# Patient Record
Sex: Female | Born: 1990 | Race: Black or African American | Hispanic: No | Marital: Married | Smoking: Current every day smoker
Health system: Southern US, Community
[De-identification: ages and names within clinical notes are randomized; demographics above are authoritative.]

## PROBLEM LIST (undated history)

## (undated) DIAGNOSIS — F319 Bipolar disorder, unspecified: Secondary | ICD-10-CM

## (undated) DIAGNOSIS — F209 Schizophrenia, unspecified: Secondary | ICD-10-CM

## (undated) DIAGNOSIS — F32A Depression, unspecified: Secondary | ICD-10-CM

## (undated) DIAGNOSIS — F419 Anxiety disorder, unspecified: Secondary | ICD-10-CM

## (undated) HISTORY — PX: APPENDECTOMY: SHX54

## (undated) HISTORY — PX: ABDOMINAL SURGERY: SHX537

---

## 2022-02-07 ENCOUNTER — Encounter (HOSPITAL_COMMUNITY): Payer: Self-pay | Admitting: Emergency Medicine

## 2022-02-07 ENCOUNTER — Other Ambulatory Visit: Payer: Self-pay

## 2022-02-07 ENCOUNTER — Emergency Department (HOSPITAL_COMMUNITY): Payer: Medicare Other

## 2022-02-07 ENCOUNTER — Emergency Department (HOSPITAL_COMMUNITY)
Admission: EM | Admit: 2022-02-07 | Discharge: 2022-02-07 | Disposition: A | Discharge status: Home / Self Care | Payer: Medicare Other | Attending: Emergency Medicine | Admitting: Emergency Medicine

## 2022-02-07 DIAGNOSIS — N9489 Other specified conditions associated with female genital organs and menstrual cycle: Secondary | ICD-10-CM | POA: Diagnosis not present

## 2022-02-07 DIAGNOSIS — R1084 Generalized abdominal pain: Secondary | ICD-10-CM | POA: Insufficient documentation

## 2022-02-07 DIAGNOSIS — R7309 Other abnormal glucose: Secondary | ICD-10-CM | POA: Insufficient documentation

## 2022-02-07 DIAGNOSIS — R112 Nausea with vomiting, unspecified: Secondary | ICD-10-CM | POA: Insufficient documentation

## 2022-02-07 HISTORY — DX: Depression, unspecified: F32.A

## 2022-02-07 HISTORY — DX: Schizophrenia, unspecified: F20.9

## 2022-02-07 HISTORY — DX: Bipolar disorder, unspecified: F31.9

## 2022-02-07 HISTORY — DX: Anxiety disorder, unspecified: F41.9

## 2022-02-07 LAB — COMPREHENSIVE METABOLIC PANEL
ALT: 46 U/L — ABNORMAL HIGH (ref 0–44)
AST: 56 U/L — ABNORMAL HIGH (ref 15–41)
Albumin: 4.4 g/dL (ref 3.5–5.0)
Alkaline Phosphatase: 64 U/L (ref 38–126)
Anion gap: 11 (ref 5–15)
BUN: 5 mg/dL — ABNORMAL LOW (ref 6–20)
CO2: 23 mmol/L (ref 22–32)
Calcium: 9.5 mg/dL (ref 8.9–10.3)
Chloride: 103 mmol/L (ref 98–111)
Creatinine, Ser: 0.91 mg/dL (ref 0.44–1.00)
GFR, Estimated: >60 mL/min (ref >60)
Glucose, Bld: 74 mg/dL (ref 70–99)
Potassium: 4.1 mmol/L (ref 3.5–5.1)
Sodium: 137 mmol/L (ref 135–145)
Total Bilirubin: 1.5 mg/dL — ABNORMAL HIGH (ref 0.3–1.2)
Total Protein: 7.9 g/dL (ref 6.5–8.1)

## 2022-02-07 LAB — LIPASE, BLOOD: Lipase: 32 U/L (ref 11–51)

## 2022-02-07 LAB — CBC
HCT: 43.3 % (ref 36.0–46.0)
Hemoglobin: 14.5 g/dL (ref 12.0–15.0)
MCH: 33.8 pg (ref 26.0–34.0)
MCHC: 33.5 g/dL (ref 30.0–36.0)
MCV: 100.9 fL — ABNORMAL HIGH (ref 80.0–100.0)
Platelets: 191 10*3/uL (ref 150–400)
RBC: 4.29 MIL/uL (ref 3.87–5.11)
RDW: 15.1 % (ref 11.5–15.5)
WBC: 7.4 10*3/uL (ref 4.0–10.5)
nRBC: 0 % (ref 0.0–0.2)

## 2022-02-07 LAB — CBG MONITORING, ED
Glucose-Capillary: 66 mg/dL — ABNORMAL LOW (ref 70–99)
Glucose-Capillary: 68 mg/dL — ABNORMAL LOW (ref 70–99)

## 2022-02-07 LAB — I-STAT BETA HCG BLOOD, ED (MC, WL, AP ONLY): I-stat hCG, quantitative: <5 m[IU]/mL (ref <5)

## 2022-02-07 MED ORDER — DIPHENHYDRAMINE HCL 50 MG/ML IJ SOLN: 25.0000 mg | Freq: Once | INTRAMUSCULAR | Status: DC

## 2022-02-07 MED ORDER — PROMETHAZINE HCL 25 MG RE SUPP: 25.0000 mg | Freq: Four times a day (QID) | RECTAL | 0 refills | Status: DC | PRN

## 2022-02-07 MED ORDER — IOHEXOL 300 MG/ML  SOLN
80.0000 mL | Freq: Once | INTRAMUSCULAR | Status: AC | PRN
  Administered 2022-02-07: 80 mL via INTRAVENOUS

## 2022-02-07 MED ORDER — ONDANSETRON 4 MG PO TBDP: ORAL_TABLET | ORAL | 0 refills | Status: DC

## 2022-02-07 MED ORDER — ALUM & MAG HYDROXIDE-SIMETH 200-200-20 MG/5ML PO SUSP: 30.0000 mL | Freq: Once | ORAL | Status: DC

## 2022-02-07 MED ORDER — PROCHLORPERAZINE EDISYLATE 10 MG/2ML IJ SOLN
10.0000 mg | Freq: Once | INTRAMUSCULAR | Status: DC
Start: 2022-02-07 — End: 2022-02-08

## 2022-02-07 NOTE — ED Provider Triage Note (Signed)
Emergency Medicine Provider Triage Evaluation Note ? ?Jenna Shelton , a 31 y.o. female  was evaluated in triage.  Pt complains of abdominal pain, nausea, vomiting since yesterday.  Says that this started out of nowhere.  Has a history of appendectomy and bowel obstruction x2.  Did have abdominal surgery for this around 10 years ago.  No urinary symptoms or vaginal symptoms.  Says that she is throwing up everything that she eats or drinks ? ?Review of Systems  ?Positive: As above ?Negative: Constipation or diarrhea ? ?Physical Exam  ?BP 116/81   Pulse 93   Temp 97.9 ?F (36.6 ?C) (Oral)   Resp 17   Ht 5\' 7"  (1.702 m)   Wt 64.9 kg   SpO2 100%   BMI 22.40 kg/m?  ?Gen:   Awake, no distress   ?Resp:  Normal effort  ?MSK:   Moves extremities without difficulty  ?Other:  Tenderness to epigastrium and right upper quadrant.  Positive Murphy sign.  ? ?Medical Decision Making  ?Medically screening exam initiated at 12:04 PM.  Appropriate orders placed.  Harold Sweeting was informed that the remainder of the evaluation will be completed by another provider, this initial triage assessment does not replace that evaluation, and the importance of remaining in the ED until their evaluation is complete. ? ?CT ordered ?  ? , PA-C ?02/07/22 1207 ? ?

## 2022-02-07 NOTE — ED Triage Notes (Signed)
Pt reports eating a [place] sandwich yesterday, vomited after. Pt reports pain all day yesterday. Pt states vomited several times this morning. Pt alert, appropriate pain 10/10. Hx of bowel obstruction and appy. NAD at present.  ?

## 2022-02-07 NOTE — ED Provider Notes (Signed)
?[facility] EMERGENCY DEPARTMENT ?Provider Note ? ? ?CSN: [id number] ?Arrival date & time: 02/07/22  1126 ? ?  ? ?History ? ?Chief Complaint  ?Patient presents with  ? Abdominal Pain  ? ? ?Jenna Shelton is a 31 y.o. female. ? ?31 yo F with a cc of abdominal pain and vomiting.  Started this morning.  No fevers, no known sick contacts, no diarrhea.  Mild diffuse abdominal pain.  Hx of sbo while pregnant.   ? ? ?Abdominal Pain ? ?  ? ?Home Medications ?Prior to Admission medications   ?Medication Sig Start Date End Date Taking? Authorizing Provider  ?ondansetron (ZOFRAN-ODT) 4 MG disintegrating tablet 4mg  ODT q4 hours prn nausea/vomit 02/07/22  Yes 04/09/22, DO  ?promethazine (PHENERGAN) 25 MG suppository Place 1 suppository (25 mg total) rectally every 6 (six) hours as needed for nausea or vomiting. 02/07/22  Yes 04/09/22, DO  ?   ? ?Allergies    ?Tape, Chocolate, and Tramadol   ? ?Review of Systems   ?Review of Systems  ?Gastrointestinal:  Positive for abdominal pain.  ? ?Physical Exam ?Updated Vital Signs ?BP 125/90 (BP Location: Right Arm)   Pulse 78   Temp 98.1 ?F (36.7 ?C) (Oral)   Resp 18   Ht 5\' 7"  (1.702 m)   Wt 64.9 kg   SpO2 100%   BMI 22.40 kg/m?  ?Physical Exam ?Vitals and nursing note reviewed.  ?Constitutional:   ?   General: She is not in acute distress. ?   Appearance: She is well-developed. She is not diaphoretic.  ?HENT:  ?   Head: Normocephalic and atraumatic.  ?Eyes:  ?   Pupils: Pupils are equal, round, and reactive to light.  ?Cardiovascular:  ?   Rate and Rhythm: Normal rate and regular rhythm.  ?   Heart sounds: No murmur heard. ?  No friction rub. No gallop.  ?Pulmonary:  ?   Effort: Pulmonary effort is normal.  ?   Breath sounds: No wheezing or rales.  ?Abdominal:  ?   General: There is no distension.  ?   Palpations: Abdomen is soft.  ?   Tenderness: There is no abdominal tenderness.  ?   Comments: Benign abdominal exam  ?Musculoskeletal:     ?   General: No  tenderness.  ?   Cervical back: Normal range of motion and neck supple.  ?Skin: ?   General: Skin is warm and dry.  ?Neurological:  ?   Mental Status: She is alert and oriented to person, place, and time.  ?Psychiatric:     ?   Behavior: Behavior normal.  ? ? ?ED Results / Procedures / Treatments   ?Labs ?(all labs ordered are listed, but only abnormal results are displayed) ?Labs Reviewed  ?COMPREHENSIVE METABOLIC PANEL - Abnormal; Notable for the following components:  ?    Result Value  ? BUN 5 (*)   ? AST 56 (*)   ? ALT 46 (*)   ? Total Bilirubin 1.5 (*)   ? All other components within normal limits  ?CBC - Abnormal; Notable for the following components:  ? MCV 100.9 (*)   ? All other components within normal limits  ?CBG MONITORING, ED - Abnormal; Notable for the following components:  ? Glucose-Capillary 66 (*)   ? All other components within normal limits  ?CBG MONITORING, ED - Abnormal; Notable for the following components:  ? Glucose-Capillary 68 (*)   ? All other components within normal limits  ?  LIPASE, BLOOD  ?URINALYSIS, ROUTINE W REFLEX MICROSCOPIC  ?I-STAT BETA HCG BLOOD, ED ([facility], [facility], [facility] ONLY)  ?CBG MONITORING, ED  ? ? ?EKG ?None ? ?Radiology ?CT ABDOMEN PELVIS W CONTRAST ? ?Result Date: 02/07/2022 ?CLINICAL DATA:  Abdominal pain, acute, nonlocalized EXAM: CT ABDOMEN AND PELVIS WITH CONTRAST TECHNIQUE: Multidetector CT imaging of the abdomen and pelvis was performed using the standard protocol following bolus administration of intravenous contrast. RADIATION DOSE REDUCTION: This exam was performed according to the departmental dose-optimization program which includes automated exposure control, adjustment of the mA and/or kV according to patient size and/or use of iterative reconstruction technique. CONTRAST:  55mL OMNIPAQUE IOHEXOL 300 MG/ML  SOLN COMPARISON:  None Available. FINDINGS: Lower chest: No acute abnormality. Hepatobiliary: No focal liver abnormality is seen. The gallbladder is unremarkable.  Pancreas: Unremarkable. No pancreatic ductal dilatation or surrounding inflammatory changes. Spleen: Normal in size without focal abnormality. Adrenals/Urinary Tract: Adrenal glands are unremarkable. No hydronephrosis or nephrolithiasis. The bladder is mildly distended. Stomach/Bowel: The stomach is within normal limits. There is no evidence of bowel obstruction.Prior appendectomy. Vascular/Lymphatic: Aortoiliac atherosclerosis. No AAA. No lymphadenopathy. Reproductive: Unremarkable for age. There is an IUD in place. There is a spongiform material within the vagina, possibly a tampon. Other: No abdominopelvic ascites.  No free air.  No hernia. Musculoskeletal: No acute or significant osseous findings. IMPRESSION: No acute findings in the abdomen or pelvis. Prior appendectomy. Tampon in place. Electronically Signed   By: Caprice Renshaw M.D.   On: 02/07/2022 14:20  ? ?CT US GUIDE VASC ACCESS LT NO REPORT ? ?Result Date: 02/07/2022 ?There is no Radiologist interpretation  for this exam.  ? ?Procedures ?Procedures  ? ? ?Medications Ordered in ED ?Medications  ?prochlorperazine (COMPAZINE) injection 10 mg (has no administration in time range)  ?diphenhydrAMINE (BENADRYL) injection 25 mg (has no administration in time range)  ?alum & mag hydroxide-simeth (MAALOX/MYLANTA) 200-200-20 MG/5ML suspension 30 mL (has no administration in time range)  ?iohexol (OMNIPAQUE) 300 MG/ML solution 80 mL (80 mLs Intravenous Contrast Given 02/07/22 1359)  ? ? ?ED Course/ Medical Decision Making/ A&P ?  ?                        ?Medical Decision Making ?Risk ?OTC drugs. ?Prescription drug management. ? ? ?31 yo F with a cc of n/v.  Started this morning.  CT without concerning finding.  No significant abdominal pain on exam.  Labwork without significant anemia, no significant electrolyte abnormalities.  LFT and lipase unremarkable. POC glucose mildly low.  Oral trial antiemetics.   ? ?Patient tells me she feels better a month ago home.  We will  have her follow-up with her family doctor. ? ?7:31 PM:  I have discussed the diagnosis/risks/treatment options with the patient.  Evaluation and diagnostic testing in the emergency department does not suggest an emergent condition requiring admission or immediate intervention beyond what has been performed at this time.  They will follow up with  PCP. We also discussed returning to the ED immediately if new or worsening sx occur. We discussed the sx which are most concerning (e.g., sudden worsening pain, fever, inability to tolerate by mouth) that necessitate immediate return. Medications administered to the patient during their visit and any new prescriptions provided to the patient are listed below. ? ?Medications given during this visit ?Medications  ?prochlorperazine (COMPAZINE) injection 10 mg (has no administration in time range)  ?diphenhydrAMINE (BENADRYL) injection 25 mg (has no administration in time  range)  ?alum & mag hydroxide-simeth (MAALOX/MYLANTA) 200-200-20 MG/5ML suspension 30 mL (has no administration in time range)  ?iohexol (OMNIPAQUE) 300 MG/ML solution 80 mL (80 mLs Intravenous Contrast Given 02/07/22 1359)  ? ? ? ?The patient appears reasonably screen and/or stabilized for discharge and I doubt any other medical condition or other [facility] requiring further screening, evaluation, or treatment in the ED at this time prior to discharge.  ? ? ? ? ? ? ? ? ?Final Clinical Impression(s) / ED Diagnoses ?Final diagnoses:  ?Generalized abdominal pain  ? ? ?Rx / DC Orders ?ED Discharge Orders   ? ?      Ordered  ?  promethazine (PHENERGAN) 25 MG suppository  Every 6 hours PRN       ? 02/07/22 1930  ?  ondansetron (ZOFRAN-ODT) 4 MG disintegrating tablet       ? 02/07/22 1930  ? ?  ?  ? ?  ? ? ?  ?Melene PlanFloyd, Darryl Willner, DO ?02/07/22 1931 ? ?

## 2022-02-07 NOTE — ED Notes (Signed)
Patient walking out of room. Patient asked to stay to removed IV. Patient refuses to stay and talk to doctor. Warned of risks of leaving. IV removed. Patient leaves promptly to lobby with family. MD notified. ?

## 2022-02-07 NOTE — ED Notes (Signed)
Requested urine from pt, pt stated "does not have to urinate". This NT notified pt that urine is apart of their care while being treated in the ED, without urine it cane delay the pt's care. Pt acknowledges. ?

## 2022-03-24 ENCOUNTER — Emergency Department (EMERGENCY_DEPARTMENT_HOSPITAL): Payer: Medicare Other | Admitting: Anesthesiology

## 2022-03-24 ENCOUNTER — Other Ambulatory Visit: Payer: Self-pay

## 2022-03-24 ENCOUNTER — Emergency Department (HOSPITAL_COMMUNITY): Payer: Medicare Other

## 2022-03-24 ENCOUNTER — Emergency Department (HOSPITAL_COMMUNITY): Payer: Medicare Other | Admitting: Anesthesiology

## 2022-03-24 ENCOUNTER — Encounter (HOSPITAL_COMMUNITY): Payer: Self-pay

## 2022-03-24 ENCOUNTER — Encounter (HOSPITAL_COMMUNITY)
Admission: EM | Disposition: A | Discharge status: Home / Self Care | Payer: Self-pay | Source: Home / Self Care | Attending: Obstetrics and Gynecology

## 2022-03-24 ENCOUNTER — Inpatient Hospital Stay (HOSPITAL_COMMUNITY)
Admission: EM | Admit: 2022-03-24 | Discharge: 2022-03-28 | DRG: 743 | Disposition: A | Discharge status: Home / Self Care | Payer: Medicare Other | Attending: Obstetrics and Gynecology | Admitting: Obstetrics and Gynecology

## 2022-03-24 DIAGNOSIS — N83512 Torsion of left ovary and ovarian pedicle: Principal | ICD-10-CM | POA: Diagnosis present

## 2022-03-24 DIAGNOSIS — R110 Nausea: Secondary | ICD-10-CM

## 2022-03-24 DIAGNOSIS — R1032 Left lower quadrant pain: Secondary | ICD-10-CM

## 2022-03-24 DIAGNOSIS — N736 Female pelvic peritoneal adhesions (postinfective): Secondary | ICD-10-CM | POA: Diagnosis present

## 2022-03-24 DIAGNOSIS — Z5331 Laparoscopic surgical procedure converted to open procedure: Secondary | ICD-10-CM | POA: Diagnosis not present

## 2022-03-24 DIAGNOSIS — Z9889 Other specified postprocedural states: Secondary | ICD-10-CM

## 2022-03-24 DIAGNOSIS — N83519 Torsion of ovary and ovarian pedicle, unspecified side: Secondary | ICD-10-CM | POA: Diagnosis present

## 2022-03-24 HISTORY — PX: DIAGNOSTIC LAPAROSCOPY WITH REMOVAL OF ECTOPIC PREGNANCY: SHX6449

## 2022-03-24 HISTORY — DX: Torsion of ovary and ovarian pedicle, unspecified side: N83.519

## 2022-03-24 HISTORY — PX: OOPHORECTOMY: SHX6387

## 2022-03-24 LAB — URINALYSIS, ROUTINE W REFLEX MICROSCOPIC
Bacteria, UA: NONE SEEN
Bilirubin Urine: NEGATIVE
Glucose, UA: NEGATIVE mg/dL
Hgb urine dipstick: NEGATIVE
Ketones, ur: 80 mg/dL — AB
Leukocytes,Ua: NEGATIVE
Nitrite: NEGATIVE
Protein, ur: 100 mg/dL — AB
Specific Gravity, Urine: 1.024 (ref 1.005–1.030)
pH: 7 (ref 5.0–8.0)

## 2022-03-24 LAB — CBC
HCT: 42.8 % (ref 36.0–46.0)
Hemoglobin: 14.8 g/dL (ref 12.0–15.0)
MCH: 35.7 pg — ABNORMAL HIGH (ref 26.0–34.0)
MCHC: 34.6 g/dL (ref 30.0–36.0)
MCV: 103.1 fL — ABNORMAL HIGH (ref 80.0–100.0)
Platelets: UNDETERMINED 10*3/uL (ref 150–400)
RBC: 4.15 MIL/uL (ref 3.87–5.11)
RDW: 13.4 % (ref 11.5–15.5)
WBC: 12.5 10*3/uL — ABNORMAL HIGH (ref 4.0–10.5)
nRBC: 0 % (ref 0.0–0.2)

## 2022-03-24 LAB — COMPREHENSIVE METABOLIC PANEL
ALT: 26 U/L (ref 0–44)
AST: 28 U/L (ref 15–41)
Albumin: 5 g/dL (ref 3.5–5.0)
Alkaline Phosphatase: 63 U/L (ref 38–126)
Anion gap: 15 (ref 5–15)
BUN: 8 mg/dL (ref 6–20)
CO2: 21 mmol/L — ABNORMAL LOW (ref 22–32)
Calcium: 10.3 mg/dL (ref 8.9–10.3)
Chloride: 100 mmol/L (ref 98–111)
Creatinine, Ser: 0.76 mg/dL (ref 0.44–1.00)
GFR, Estimated: >60 mL/min (ref >60)
Glucose, Bld: 121 mg/dL — ABNORMAL HIGH (ref 70–99)
Potassium: 3.7 mmol/L (ref 3.5–5.1)
Sodium: 136 mmol/L (ref 135–145)
Total Bilirubin: 0.9 mg/dL (ref 0.3–1.2)
Total Protein: 9.1 g/dL — ABNORMAL HIGH (ref 6.5–8.1)

## 2022-03-24 LAB — I-STAT BETA HCG BLOOD, ED (MC, WL, AP ONLY): I-stat hCG, quantitative: <5 m[IU]/mL (ref <5)

## 2022-03-24 LAB — TYPE AND SCREEN
ABO/RH(D): O POS
Antibody Screen: NEGATIVE

## 2022-03-24 LAB — LIPASE, BLOOD: Lipase: 21 U/L (ref 11–51)

## 2022-03-24 LAB — GLUCOSE, CAPILLARY: Glucose-Capillary: 125 mg/dL — ABNORMAL HIGH (ref 70–99)

## 2022-03-24 LAB — ABO/RH: ABO/RH(D): O POS

## 2022-03-24 SURGERY — LAPAROSCOPY, WITH ECTOPIC PREGNANCY SURGICAL TREATMENT
Anesthesia: General

## 2022-03-24 MED ORDER — HYDROMORPHONE HCL 1 MG/ML IJ SOLN
0.2500 mg | INTRAMUSCULAR | Status: DC | PRN
  Administered 2022-03-24 (×2): 0.5 mg via INTRAVENOUS

## 2022-03-24 MED ORDER — MORPHINE SULFATE (PF) 4 MG/ML IV SOLN
4.0000 mg | Freq: Once | INTRAVENOUS | Status: AC
  Administered 2022-03-24: 4 mg via INTRAVENOUS
  Filled 2022-03-24: qty 1

## 2022-03-24 MED ORDER — ACETAMINOPHEN 10 MG/ML IV SOLN
INTRAVENOUS | Status: AC
  Filled 2022-03-24: qty 100

## 2022-03-24 MED ORDER — ROCURONIUM BROMIDE 100 MG/10ML IV SOLN
INTRAVENOUS | Status: DC | PRN
  Administered 2022-03-24: 40 mg via INTRAVENOUS
  Administered 2022-03-24: 10 mg via INTRAVENOUS
  Administered 2022-03-24: 20 mg via INTRAVENOUS
  Administered 2022-03-24: 10 mg via INTRAVENOUS

## 2022-03-24 MED ORDER — FENTANYL CITRATE (PF) 100 MCG/2ML IJ SOLN
INTRAMUSCULAR | Status: DC | PRN
Start: 2022-03-24 — End: 2022-03-24
  Administered 2022-03-24 (×2): 50 ug via INTRAVENOUS
  Administered 2022-03-24: 100 ug via INTRAVENOUS
  Administered 2022-03-24 (×3): 50 ug via INTRAVENOUS
  Administered 2022-03-24: 100 ug via INTRAVENOUS
  Administered 2022-03-24: 50 ug via INTRAVENOUS

## 2022-03-24 MED ORDER — KETOROLAC TROMETHAMINE 30 MG/ML IJ SOLN: 30.0000 mg | Freq: Once | INTRAMUSCULAR | Status: DC

## 2022-03-24 MED ORDER — OXYCODONE HCL 5 MG PO TABS: 5.0000 mg | ORAL_TABLET | Freq: Once | ORAL | Status: DC | PRN

## 2022-03-24 MED ORDER — GLYCOPYRROLATE PF 0.2 MG/ML IJ SOSY
PREFILLED_SYRINGE | INTRAMUSCULAR | Status: AC
  Filled 2022-03-24: qty 1

## 2022-03-24 MED ORDER — LACTATED RINGERS IV SOLN: INTRAVENOUS | Status: DC

## 2022-03-24 MED ORDER — DEXAMETHASONE SODIUM PHOSPHATE 10 MG/ML IJ SOLN
INTRAMUSCULAR | Status: DC | PRN
  Administered 2022-03-24: 10 mg via INTRAVENOUS

## 2022-03-24 MED ORDER — KETOROLAC TROMETHAMINE 30 MG/ML IJ SOLN
INTRAMUSCULAR | Status: DC | PRN
  Administered 2022-03-24: 30 mg via INTRAVENOUS

## 2022-03-24 MED ORDER — CEFAZOLIN SODIUM-DEXTROSE 2-3 GM-%(50ML) IV SOLR
INTRAVENOUS | Status: DC | PRN
  Administered 2022-03-24: 2 g via INTRAVENOUS

## 2022-03-24 MED ORDER — BUPIVACAINE HCL (PF) 0.25 % IJ SOLN
INTRAMUSCULAR | Status: AC
  Filled 2022-03-24: qty 30

## 2022-03-24 MED ORDER — BUPIVACAINE HCL (PF) 0.25 % IJ SOLN
INTRAMUSCULAR | Status: DC | PRN
  Administered 2022-03-24: 6 mL

## 2022-03-24 MED ORDER — BUPIVACAINE LIPOSOME 1.3 % IJ SUSP
INTRAMUSCULAR | Status: DC | PRN
  Administered 2022-03-24: 20 mL

## 2022-03-24 MED ORDER — SUCCINYLCHOLINE CHLORIDE 200 MG/10ML IV SOSY
PREFILLED_SYRINGE | INTRAVENOUS | Status: AC
  Filled 2022-03-24: qty 10

## 2022-03-24 MED ORDER — ROCURONIUM BROMIDE 10 MG/ML (PF) SYRINGE
PREFILLED_SYRINGE | INTRAVENOUS | Status: AC
  Filled 2022-03-24: qty 10

## 2022-03-24 MED ORDER — HYDROMORPHONE HCL 1 MG/ML IJ SOLN
1.0000 mg | Freq: Once | INTRAMUSCULAR | Status: AC
  Administered 2022-03-24: 1 mg via INTRAVENOUS
  Filled 2022-03-24: qty 1

## 2022-03-24 MED ORDER — KETAMINE HCL 10 MG/ML IJ SOLN
INTRAMUSCULAR | Status: DC | PRN
  Administered 2022-03-24 (×2): 25 mg via INTRAVENOUS

## 2022-03-24 MED ORDER — MIDAZOLAM HCL 2 MG/2ML IJ SOLN: 0.5000 mg | Freq: Once | INTRAMUSCULAR | Status: DC | PRN

## 2022-03-24 MED ORDER — SUCCINYLCHOLINE CHLORIDE 200 MG/10ML IV SOSY
PREFILLED_SYRINGE | INTRAVENOUS | Status: DC | PRN
  Administered 2022-03-24: 100 mg via INTRAVENOUS

## 2022-03-24 MED ORDER — SCOPOLAMINE 1 MG/3DAYS TD PT72
1.0000 | MEDICATED_PATCH | TRANSDERMAL | Status: AC
  Administered 2022-03-24: 1 via TRANSDERMAL

## 2022-03-24 MED ORDER — PHENYLEPHRINE HCL (PRESSORS) 10 MG/ML IV SOLN
INTRAVENOUS | Status: DC | PRN
  Administered 2022-03-24 (×3): 40 ug via INTRAVENOUS

## 2022-03-24 MED ORDER — MIDAZOLAM HCL 2 MG/2ML IJ SOLN
INTRAMUSCULAR | Status: AC
  Filled 2022-03-24: qty 2

## 2022-03-24 MED ORDER — PROPOFOL 10 MG/ML IV BOLUS
INTRAVENOUS | Status: AC
  Filled 2022-03-24: qty 20

## 2022-03-24 MED ORDER — ACETAMINOPHEN 10 MG/ML IV SOLN
INTRAVENOUS | Status: DC | PRN
  Administered 2022-03-24: 1000 mg via INTRAVENOUS

## 2022-03-24 MED ORDER — OXYCODONE HCL 5 MG/5ML PO SOLN: 5.0000 mg | Freq: Once | ORAL | Status: DC | PRN

## 2022-03-24 MED ORDER — ORAL CARE MOUTH RINSE: 15.0000 mL | Freq: Once | OROMUCOSAL | Status: DC

## 2022-03-24 MED ORDER — FENTANYL CITRATE (PF) 250 MCG/5ML IJ SOLN
INTRAMUSCULAR | Status: AC
  Filled 2022-03-24: qty 5

## 2022-03-24 MED ORDER — 0.9 % SODIUM CHLORIDE (POUR BTL) OPTIME
TOPICAL | Status: DC | PRN
  Administered 2022-03-24: 1000 mL

## 2022-03-24 MED ORDER — LIDOCAINE 2% (20 MG/ML) 5 ML SYRINGE
INTRAMUSCULAR | Status: AC
  Filled 2022-03-24: qty 5

## 2022-03-24 MED ORDER — POVIDONE-IODINE 10 % EX SWAB: 2.0000 | Freq: Once | CUTANEOUS | Status: DC

## 2022-03-24 MED ORDER — LIDOCAINE HCL (CARDIAC) PF 100 MG/5ML IV SOSY
PREFILLED_SYRINGE | INTRAVENOUS | Status: DC | PRN
  Administered 2022-03-24: 50 mg via INTRAVENOUS

## 2022-03-24 MED ORDER — HYDROMORPHONE HCL 1 MG/ML IJ SOLN
INTRAMUSCULAR | Status: AC
  Filled 2022-03-24: qty 1

## 2022-03-24 MED ORDER — KETAMINE HCL 50 MG/5ML IJ SOSY
PREFILLED_SYRINGE | INTRAMUSCULAR | Status: AC
  Filled 2022-03-24: qty 5

## 2022-03-24 MED ORDER — ONDANSETRON HCL 4 MG/2ML IJ SOLN
INTRAMUSCULAR | Status: DC | PRN
  Administered 2022-03-24: 4 mg via INTRAVENOUS

## 2022-03-24 MED ORDER — SODIUM CHLORIDE 0.9 % IV BOLUS
1000.0000 mL | Freq: Once | INTRAVENOUS | Status: AC
  Administered 2022-03-24: 1000 mL via INTRAVENOUS

## 2022-03-24 MED ORDER — PROPOFOL 10 MG/ML IV BOLUS
INTRAVENOUS | Status: DC | PRN
  Administered 2022-03-24: 200 mg via INTRAVENOUS

## 2022-03-24 MED ORDER — MEPERIDINE HCL 25 MG/ML IJ SOLN: 6.2500 mg | INTRAMUSCULAR | Status: DC | PRN

## 2022-03-24 MED ORDER — SCOPOLAMINE 1 MG/3DAYS TD PT72
MEDICATED_PATCH | TRANSDERMAL | Status: AC
  Filled 2022-03-24: qty 1

## 2022-03-24 MED ORDER — ONDANSETRON HCL 4 MG/2ML IJ SOLN
INTRAMUSCULAR | Status: AC
  Filled 2022-03-24: qty 2

## 2022-03-24 MED ORDER — SUGAMMADEX SODIUM 200 MG/2ML IV SOLN
INTRAVENOUS | Status: DC | PRN
  Administered 2022-03-24: 200 mg via INTRAVENOUS

## 2022-03-24 MED ORDER — HEMOSTATIC AGENTS (NO CHARGE) OPTIME
TOPICAL | Status: DC | PRN
  Administered 2022-03-24: 1 via TOPICAL

## 2022-03-24 MED ORDER — BUPIVACAINE LIPOSOME 1.3 % IJ SUSP
INTRAMUSCULAR | Status: AC
  Filled 2022-03-24: qty 20

## 2022-03-24 MED ORDER — ONDANSETRON HCL 4 MG/2ML IJ SOLN
4.0000 mg | Freq: Once | INTRAMUSCULAR | Status: AC
Start: 2022-03-24 — End: 2022-03-24
  Administered 2022-03-24: 4 mg via INTRAVENOUS
  Filled 2022-03-24: qty 2

## 2022-03-24 MED ORDER — LACTATED RINGERS IV SOLN: INTRAVENOUS | Status: DC | PRN

## 2022-03-24 MED ORDER — IOHEXOL 300 MG/ML  SOLN
80.0000 mL | Freq: Once | INTRAMUSCULAR | Status: AC | PRN
Start: 2022-03-24 — End: 2022-03-24
  Administered 2022-03-24: 80 mL via INTRAVENOUS

## 2022-03-24 MED ORDER — CHLORHEXIDINE GLUCONATE 0.12 % MT SOLN
OROMUCOSAL | Status: AC
  Filled 2022-03-24: qty 15

## 2022-03-24 MED ORDER — ONDANSETRON HCL 4 MG/2ML IJ SOLN
4.0000 mg | Freq: Once | INTRAMUSCULAR | Status: AC
  Administered 2022-03-24: 4 mg via INTRAVENOUS
  Filled 2022-03-24: qty 2

## 2022-03-24 MED ORDER — MIDAZOLAM HCL 2 MG/2ML IJ SOLN
INTRAMUSCULAR | Status: DC | PRN
  Administered 2022-03-24: 2 mg via INTRAVENOUS

## 2022-03-24 MED ORDER — CHLORHEXIDINE GLUCONATE 0.12 % MT SOLN: 15.0000 mL | Freq: Once | OROMUCOSAL | Status: DC

## 2022-03-24 SURGICAL SUPPLY — 44 items
CABLE HIGH FREQUENCY MONO STRZ (ELECTRODE) IMPLANT
CNTNR URN SCR LID CUP LEK RST (MISCELLANEOUS) IMPLANT
CONT SPEC 4OZ STRL OR WHT (MISCELLANEOUS) ×2
DERMABOND ADVANCED (GAUZE/BANDAGES/DRESSINGS)
DERMABOND ADVANCED .7 DNX12 (GAUZE/BANDAGES/DRESSINGS) IMPLANT
DRAPE HYSTEROSCOPY (MISCELLANEOUS) ×1 IMPLANT
DRSG OPSITE POSTOP 4X10 (GAUZE/BANDAGES/DRESSINGS) ×1 IMPLANT
DURAPREP 26ML APPLICATOR (WOUND CARE) ×3 IMPLANT
GLOVE BIO SURGEON STRL SZ 6.5 (GLOVE) ×3 IMPLANT
GLOVE BIOGEL PI IND STRL 7.0 (GLOVE) ×8 IMPLANT
GLOVE BIOGEL PI INDICATOR 7.0 (GLOVE) ×4
GOWN STRL REUS W/ TWL LRG LVL3 (GOWN DISPOSABLE) ×4 IMPLANT
GOWN STRL REUS W/TWL LRG LVL3 (GOWN DISPOSABLE) ×2
IRRIG SUCT STRYKERFLOW 2 WTIP (MISCELLANEOUS)
IRRIGATION SUCT STRKRFLW 2 WTP (MISCELLANEOUS) IMPLANT
KIT TURNOVER KIT B (KITS) ×3 IMPLANT
NDL INSUFFLATION 14GA 120MM (NEEDLE) ×2 IMPLANT
NEEDLE INSUFFLATION 14GA 120MM (NEEDLE) ×3 IMPLANT
NS IRRIG 1000ML POUR BTL (IV SOLUTION) ×3 IMPLANT
PACK LAPAROSCOPY BASIN (CUSTOM PROCEDURE TRAY) ×3 IMPLANT
PACK TRENDGUARD 450 HYBRID PRO (MISCELLANEOUS) IMPLANT
PENCIL SMOKE EVACUATOR (MISCELLANEOUS) ×1 IMPLANT
POUCH SPECIMEN RETRIEVAL 10MM (ENDOMECHANICALS) ×1 IMPLANT
POWDER SURGICEL 3.0 GRAM (HEMOSTASIS) ×1 IMPLANT
PROTECTOR NERVE ULNAR (MISCELLANEOUS) ×6 IMPLANT
SET TUBE SMOKE EVAC HIGH FLOW (TUBING) ×3 IMPLANT
SHEARS HARMONIC ACE PLUS 36CM (ENDOMECHANICALS) IMPLANT
SHEET LAVH (DRAPES) ×1 IMPLANT
SLEEVE ENDOPATH XCEL 5M (ENDOMECHANICALS) ×3 IMPLANT
STAPLER VISISTAT 35W (STAPLE) ×1 IMPLANT
STRIP CLOSURE SKIN 1/2X4 (GAUZE/BANDAGES/DRESSINGS) IMPLANT
SUT PDS AB 0 CTX 60 (SUTURE) ×2 IMPLANT
SUT VICRYL 0 UR6 27IN ABS (SUTURE) ×3 IMPLANT
SUT VICRYL 4-0 PS2 18IN ABS (SUTURE) ×3 IMPLANT
TOWEL GREEN STERILE FF (TOWEL DISPOSABLE) ×6 IMPLANT
TRAY FOLEY W/BAG SLVR 14FR (SET/KITS/TRAYS/PACK) ×3 IMPLANT
TRENDGUARD 450 HYBRID PRO PACK (MISCELLANEOUS) ×3
TROCAR BALLN 12MMX100 BLUNT (TROCAR) ×1 IMPLANT
TROCAR XCEL DIL TIP R 11M (ENDOMECHANICALS) ×3 IMPLANT
TROCAR XCEL NON-BLD 11X100MML (ENDOMECHANICALS) ×3 IMPLANT
TROCAR XCEL NON-BLD 5MMX100MML (ENDOMECHANICALS) ×3 IMPLANT
TUBE CONNECTING 12X1/4 (SUCTIONS) ×1 IMPLANT
WARMER LAPAROSCOPE (MISCELLANEOUS) ×3 IMPLANT
YANKAUER SUCT BULB TIP NO VENT (SUCTIONS) ×1 IMPLANT

## 2022-03-24 NOTE — ED Triage Notes (Signed)
Patient arrived by [facility] with complaint of abdominal pain with vomiting since last night, reports loose stool last pm. Denies fever, complains of sharp abdominal pain

## 2022-03-24 NOTE — H&P (Signed)
Faculty Practice Obstetrics and Gynecology Attending History and Physical 31 y.o. G0, No LMP recorded. Patient has Mirena in place for 2 years.  Jenna Shelton is a 31 y.o. female history of schizophrenia, bipolar, prior appendectomy, prior bowel obstruction for patient here for evaluation of abdominal pain.  Located to her left mid and lower abdomen.  Pain started yesterday.  Constant in nature.  She describes it as severe.  Her last bowel movement was yesterday without any melena or blood per rectum.  No dysuria, hematuria or flank pain.  She has history of prior bowel obstructions however she is unsure if this feels similar.  She has had multiple episodes of emesis, her last episode had a few specks of bright red blood.  She denies any chronic NSAID use, EtOH use.  No history of gastric ulcers.  No fever, headache, congestion, rhinorrhea, cough, back pain.  She denies chance of pregnancy.  No vaginal discharge or concern for STDs.  Took ODT Zofran last night. US shows enlargement of left ovary and no doppler flow c/w torsion. Still has significant LLQ pain  Past Medical History:  Diagnosis Date   Anxiety    Bipolar 1 disorder (HCC)    Depression    Schizophrenia (HCC)    Past Surgical History:  Procedure Laterality Date   ABDOMINAL SURGERY     APPENDECTOMY     OB History  No obstetric history on file.  Patient denies any other pertinent gynecologic issues.  No current facility-administered medications on file prior to encounter.   Current Outpatient Medications on File Prior to Encounter  Medication Sig Dispense Refill   ondansetron (ZOFRAN-ODT) 4 MG disintegrating tablet 4mg  ODT q4 hours prn nausea/vomit 20 tablet 0   promethazine (PHENERGAN) 25 MG suppository Place 1 suppository (25 mg total) rectally every 6 (six) hours as needed for nausea or vomiting. 12 each 0   Allergies  Allergen Reactions   Tape Hives    blisters   Chocolate Rash   Tramadol Rash    Social History:    reports current drug use. Drug: Marijuana. No family history on file.  Review of Systems: Pertinent items noted in HPI and remainder of comprehensive ROS otherwise negative.  PHYSICAL EXAM: Blood pressure (!) 137/99, pulse 72, temperature 98 F (36.7 C), resp. rate 15, SpO2 100 %. CONSTITUTIONAL: Well-developed, well-nourished female in moderate distress HENT:  Normocephalic, atraumatic, External right and left ear normal. Oropharynx is clear and moist EYES: Conjunctivae and EOM are normal. Pupils are equal, round, and reactive to light. No scleral icterus.  NECK: Normal range of motion, supple, no masses SKIN: Skin is warm and dry. No rash noted. Not diaphoretic. No erythema. No pallor. NEUROLOGIC: Alert and oriented to person, place, and time. Normal reflexes, muscle tone coordination. No cranial nerve deficit noted. PSYCHIATRIC: Normal mood and affect. Normal behavior. Normal judgment and thought content. CARDIOVASCULAR: Normal heart rate noted, regular rhythm RESPIRATORY: Effort and breath sounds normal, no problems with respiration noted ABDOMEN: Guarding and LLQ tenderness no distension or mass  MUSCULOSKELETAL: Normal range of motion. No tenderness.  No cyanosis, clubbing, or edema.  .  Labs: Results for orders placed or performed during the hospital encounter of 03/24/22 (from the past 336 hour(s))  CBC   Collection Time: 03/24/22 12:47 PM  Result Value Ref Range   WBC 12.5 (H) 4.0 - 10.5 K/uL   RBC 4.15 3.87 - 5.11 MIL/uL   Hemoglobin 14.8 12.0 - 15.0 g/dL   HCT 03/26/22 69.4 -  46.0 %   MCV 103.1 (H) 80.0 - 100.0 fL   MCH 35.7 (H) 26.0 - 34.0 pg   MCHC 34.6 30.0 - 36.0 g/dL   RDW 94.1 74.0 - 81.4 %   Platelets PLATELET CLUMPS NOTED ON SMEAR, UNABLE TO ESTIMATE 150 - 400 K/uL   nRBC 0.0 0.0 - 0.2 %  Urinalysis, Routine w reflex microscopic Urine, Clean Catch   Collection Time: 03/24/22 12:47 PM  Result Value Ref Range   Color, Urine YELLOW YELLOW   APPearance HAZY (A)  CLEAR   Specific Gravity, Urine 1.024 1.005 - 1.030   pH 7.0 5.0 - 8.0   Glucose, UA NEGATIVE NEGATIVE mg/dL   Hgb urine dipstick NEGATIVE NEGATIVE   Bilirubin Urine NEGATIVE NEGATIVE   Ketones, ur 80 (A) NEGATIVE mg/dL   Protein, ur 481 (A) NEGATIVE mg/dL   Nitrite NEGATIVE NEGATIVE   Leukocytes,Ua NEGATIVE NEGATIVE   RBC / HPF 6-10 0 - 5 RBC/hpf   WBC, UA 0-5 0 - 5 WBC/hpf   Bacteria, UA NONE SEEN NONE SEEN   Squamous Epithelial / LPF 0-5 0 - 5   Mucus PRESENT   I-Stat beta hCG blood, ED   Collection Time: 03/24/22  1:01 PM  Result Value Ref Range   I-stat hCG, quantitative <5.0 <5 mIU/mL   Comment 3          Comprehensive metabolic panel   Collection Time: 03/24/22  2:30 PM  Result Value Ref Range   Sodium 136 135 - 145 mmol/L   Potassium 3.7 3.5 - 5.1 mmol/L   Chloride 100 98 - 111 mmol/L   CO2 21 (L) 22 - 32 mmol/L   Glucose, Bld 121 (H) 70 - 99 mg/dL   BUN 8 6 - 20 mg/dL   Creatinine, Ser 8.56 0.44 - 1.00 mg/dL   Calcium 31.4 8.9 - 97.0 mg/dL   Total Protein 9.1 (H) 6.5 - 8.1 g/dL   Albumin 5.0 3.5 - 5.0 g/dL   AST 28 15 - 41 U/L   ALT 26 0 - 44 U/L   Alkaline Phosphatase 63 38 - 126 U/L   Total Bilirubin 0.9 0.3 - 1.2 mg/dL   GFR, Estimated >26 >37 mL/min   Anion gap 15 5 - 15  Lipase, blood   Collection Time: 03/24/22  2:30 PM  Result Value Ref Range   Lipase 21 11 - 51 U/L    Imaging Studies: US PELVIC COMPLETE W TRANSVAGINAL AND TORSION R/O  Result Date: 03/24/2022 CLINICAL DATA:  Left lower quadrant and pelvic pain. Left adnexal mass on recent ultrasound. EXAM: TRANSABDOMINAL AND TRANSVAGINAL ULTRASOUND OF PELVIS DOPPLER ULTRASOUND OF OVARIES TECHNIQUE: Both transabdominal and transvaginal ultrasound examinations of the pelvis were performed. Transabdominal technique was performed for global imaging of the pelvis including uterus, ovaries, adnexal regions, and pelvic cul-de-sac. It was necessary to proceed with endovaginal exam following the transabdominal  exam to visualize the ovaries and left adnexal mass. Color and duplex Doppler ultrasound was utilized to evaluate blood flow to the ovaries. COMPARISON:  CT on 03/24/2022 FINDINGS: Uterus Measurements: 7.1 x 3.1 x 4.0 cm = volume: 44 mL. No fibroids or other mass visualized. Endometrium Thickness: 4 mm.  IUD is visualized within the endometrial cavity. Right ovary Measurements: 3.4 x 1.6 x 2.0 cm = volume: 5.5 mL. Normal appearance/no adnexal mass. Internal blood flow seen on color Doppler ultrasound. Left ovary Measurements: 4.4 x 3.4 x 3.3 cm = volume: 26.2 mL. The left ovary has a heterogeneous  appearance with presence of numerous tiny internal follicles as well as an area which appears hemorrhagic. No internal blood flow is seen on color Doppler ultrasound. Pulsed Doppler evaluation of both ovaries demonstrates normal low-resistance arterial and venous waveforms in the right ovary. No internal blood flow is identified within the left ovary with either duplex or power Doppler ultrasound. This is consistent with left ovarian torsion. Other findings No abnormal free fluid. IMPRESSION: Findings consistent with left ovarian torsion. Normal appearance of right ovary. IUD within the endometrial cavity. Critical Value/emergent results were called by telephone at the time of interpretation on 03/24/2022 at 5:14 pm to provider [facility] , who verbally acknowledged these results. Electronically Signed   By: Danae Orleans M.D.   On: 03/24/2022 17:17   CT ABDOMEN PELVIS W CONTRAST  Result Date: 03/24/2022 CLINICAL DATA:  Left lower quadrant pain and left pelvic pain beginning last night. Vomiting. EXAM: CT ABDOMEN AND PELVIS WITH CONTRAST TECHNIQUE: Multidetector CT imaging of the abdomen and pelvis was performed using the standard protocol following bolus administration of intravenous contrast. RADIATION DOSE REDUCTION: This exam was performed according to the departmental dose-optimization program which includes  automated exposure control, adjustment of the mA and/or kV according to patient size and/or use of iterative reconstruction technique. CONTRAST:  35mL OMNIPAQUE IOHEXOL 300 MG/ML  SOLN COMPARISON:  02/07/2022 FINDINGS: Lower Chest: No acute findings. Hepatobiliary: No hepatic masses identified. Gallbladder is unremarkable. No evidence of biliary ductal dilatation. Pancreas:  No mass or inflammatory changes. Spleen: Within normal limits in size and appearance. Adrenals/Urinary Tract: No masses identified. No evidence of ureteral calculi or hydronephrosis. Stomach/Bowel: No evidence of obstruction, inflammatory process or abnormal fluid collections. Vascular/Lymphatic: No pathologically enlarged lymph nodes. No acute vascular findings. Aortic atherosclerotic calcification incidentally noted. Reproductive: IUD is again noted. A new low-attenuation mass is seen in the left adnexa which measures 4.2 x 3.6 cm. This is suspicious for an ovarian mass or cyst. No evidence of pelvic inflammatory process or free fluid. Other:  None. Musculoskeletal:  No suspicious bone lesions identified. IMPRESSION: New 4 cm low-attenuation mass in left adnexa, suspicious for ovarian mass or cyst. Pelvic ultrasound is recommended for further characterization. Aortic Atherosclerosis (ICD10-I70.0). Electronically Signed   By: Danae Orleans M.D.   On: 03/24/2022 17:09    Assessment: Patient Active Problem List   Diagnosis Date Noted   Ovarian torsion 03/24/2022      Plan: Ovarian torsion suspected. Offered laparoscopy and repair of torsion, salpingo-oophorectomy if indicated. .  The risks of surgery were discussed in detail with the patient including but not limited to: bleeding which may require transfusion or reoperation; infection which may require prolonged hospitalization or re-hospitalization and antibiotic therapy; injury to bowel, bladder, ureters and major vessels or other surrounding organs which may lead to other procedures;  formation of adhesions; need for additional procedures including laparotomy or subsequent procedures secondary to intraoperative injury or abnormal pathology; thromboembolic phenomenon; incisional problems and other postoperative or anesthesia complications.  Patient was told that the likelihood that her condition and symptoms will be treated effectively with this surgical management was very high; the postoperative expectations were also discussed in detail. The patient also understands the alternative treatment options which were discussed in full. All questions were answered.    Adam Phenix, MD Obstetrician & Gynecologist, [facility], [facility]

## 2022-03-24 NOTE — ED Notes (Signed)
Pt is going to [facility] in OR.

## 2022-03-24 NOTE — ED Provider Notes (Signed)
[facility] EMERGENCY DEPARTMENT Provider Note   CSN: [id number] Arrival date & time: 03/24/22  1235    History  ABD pain   Sibyl Ertle is a 31 y.o. female history of schizophrenia, bipolar, prior appendectomy, prior bowel obstruction for patient here for evaluation of abdominal pain.  Located to her left mid and lower abdomen.  Pain started yesterday.  Constant in nature.  She describes it as severe.  Her last bowel movement was yesterday without any melena or blood per rectum.  No dysuria, hematuria or flank pain.  She has history of prior bowel obstructions however she is unsure if this feels similar.  She has had multiple episodes of emesis, her last episode had a few specks of bright red blood.  She denies any chronic NSAID use, EtOH use.  No history of gastric ulcers.  No fever, headache, congestion, rhinorrhea, cough, back pain.  She denies chance of pregnancy.  No vaginal discharge or concern for STDs.  Took ODT Zofran last night.  HPI     Home Medications Prior to Admission medications   Medication Sig Start Date End Date Taking? Authorizing Provider  ondansetron (ZOFRAN-ODT) 4 MG disintegrating tablet 4mg  ODT q4 hours prn nausea/vomit 02/07/22   04/09/22, DO  promethazine (PHENERGAN) 25 MG suppository Place 1 suppository (25 mg total) rectally every 6 (six) hours as needed for nausea or vomiting. 02/07/22   04/09/22, DO      Allergies    Tape, Chocolate, and Tramadol    Review of Systems   Review of Systems  Constitutional: Negative.   HENT: Negative.    Respiratory: Negative.    Cardiovascular: Negative.   Gastrointestinal:  Positive for abdominal pain, nausea and vomiting. Negative for abdominal distention, anal bleeding, blood in stool, constipation, diarrhea and rectal pain.  Genitourinary: Negative.   Musculoskeletal: Negative.   Skin: Negative.   Neurological: Negative.   All other systems reviewed and are negative.   Physical  Exam Updated Vital Signs BP (!) 137/99 (BP Location: Right Arm)   Pulse 72   Temp 98 F (36.7 C)   Resp 15   SpO2 100%  Physical Exam Vitals and nursing note reviewed.  Constitutional:      General: She is not in acute distress.    Appearance: She is well-developed. She is not ill-appearing, toxic-appearing or diaphoretic.  HENT:     Head: Normocephalic and atraumatic.     Nose: Nose normal.     Mouth/Throat:     Mouth: Mucous membranes are moist.  Eyes:     Pupils: Pupils are equal, round, and reactive to light.  Cardiovascular:     Rate and Rhythm: Normal rate.     Pulses: Normal pulses.     Heart sounds: Normal heart sounds.  Pulmonary:     Effort: Pulmonary effort is normal. No respiratory distress.     Breath sounds: Normal breath sounds.     Comments: Clear bilaterally, speaks in full sentences without difficulty Abdominal:     General: There is no distension.     Palpations: Abdomen is soft.     Tenderness: There is abdominal tenderness. There is no right CVA tenderness, left CVA tenderness, guarding or rebound.     Hernia: No hernia is present.     Comments: Diffuse left-sided tenderness worse left lower quadrant  Musculoskeletal:        General: Normal range of motion.     Cervical back: Normal range of motion.  Comments: Full range of motion  Skin:    General: Skin is warm and dry.     Capillary Refill: Capillary refill takes less than 2 seconds.     Comments: No rashes or lesions  Neurological:     General: No focal deficit present.     Mental Status: She is alert and oriented to person, place, and time.  Psychiatric:        Mood and Affect: Mood normal.     ED Results / Procedures / Treatments   Labs (all labs ordered are listed, but only abnormal results are displayed) Labs Reviewed  CBC - Abnormal; Notable for the following components:      Result Value   WBC 12.5 (*)    MCV 103.1 (*)    MCH 35.7 (*)    All other components within normal  limits  URINALYSIS, ROUTINE W REFLEX MICROSCOPIC - Abnormal; Notable for the following components:   APPearance HAZY (*)    Ketones, ur 80 (*)    Protein, ur 100 (*)    All other components within normal limits  COMPREHENSIVE METABOLIC PANEL - Abnormal; Notable for the following components:   CO2 21 (*)    Glucose, Bld 121 (*)    Total Protein 9.1 (*)    All other components within normal limits  LIPASE, BLOOD  I-STAT BETA HCG BLOOD, ED ([facility], [facility], [facility] ONLY)    EKG None  Radiology US PELVIC COMPLETE W TRANSVAGINAL AND TORSION R/O  Result Date: 03/24/2022 CLINICAL DATA:  Left lower quadrant and pelvic pain. Left adnexal mass on recent ultrasound. EXAM: TRANSABDOMINAL AND TRANSVAGINAL ULTRASOUND OF PELVIS DOPPLER ULTRASOUND OF OVARIES TECHNIQUE: Both transabdominal and transvaginal ultrasound examinations of the pelvis were performed. Transabdominal technique was performed for global imaging of the pelvis including uterus, ovaries, adnexal regions, and pelvic cul-de-sac. It was necessary to proceed with endovaginal exam following the transabdominal exam to visualize the ovaries and left adnexal mass. Color and duplex Doppler ultrasound was utilized to evaluate blood flow to the ovaries. COMPARISON:  CT on 03/24/2022 FINDINGS: Uterus Measurements: 7.1 x 3.1 x 4.0 cm = volume: 44 mL. No fibroids or other mass visualized. Endometrium Thickness: 4 mm.  IUD is visualized within the endometrial cavity. Right ovary Measurements: 3.4 x 1.6 x 2.0 cm = volume: 5.5 mL. Normal appearance/no adnexal mass. Internal blood flow seen on color Doppler ultrasound. Left ovary Measurements: 4.4 x 3.4 x 3.3 cm = volume: 26.2 mL. The left ovary has a heterogeneous appearance with presence of numerous tiny internal follicles as well as an area which appears hemorrhagic. No internal blood flow is seen on color Doppler ultrasound. Pulsed Doppler evaluation of both ovaries demonstrates normal low-resistance arterial and venous  waveforms in the right ovary. No internal blood flow is identified within the left ovary with either duplex or power Doppler ultrasound. This is consistent with left ovarian torsion. Other findings No abnormal free fluid. IMPRESSION: Findings consistent with left ovarian torsion. Normal appearance of right ovary. IUD within the endometrial cavity. Critical Value/emergent results were called by telephone at the time of interpretation on 03/24/2022 at 5:14 pm to provider [facility] , who verbally acknowledged these results. Electronically Signed   By: Danae Orleans M.D.   On: 03/24/2022 17:17   CT ABDOMEN PELVIS W CONTRAST  Result Date: 03/24/2022 CLINICAL DATA:  Left lower quadrant pain and left pelvic pain beginning last night. Vomiting. EXAM: CT ABDOMEN AND PELVIS WITH CONTRAST TECHNIQUE: Multidetector CT imaging of  the abdomen and pelvis was performed using the standard protocol following bolus administration of intravenous contrast. RADIATION DOSE REDUCTION: This exam was performed according to the departmental dose-optimization program which includes automated exposure control, adjustment of the mA and/or kV according to patient size and/or use of iterative reconstruction technique. CONTRAST:  51mL OMNIPAQUE IOHEXOL 300 MG/ML  SOLN COMPARISON:  02/07/2022 FINDINGS: Lower Chest: No acute findings. Hepatobiliary: No hepatic masses identified. Gallbladder is unremarkable. No evidence of biliary ductal dilatation. Pancreas:  No mass or inflammatory changes. Spleen: Within normal limits in size and appearance. Adrenals/Urinary Tract: No masses identified. No evidence of ureteral calculi or hydronephrosis. Stomach/Bowel: No evidence of obstruction, inflammatory process or abnormal fluid collections. Vascular/Lymphatic: No pathologically enlarged lymph nodes. No acute vascular findings. Aortic atherosclerotic calcification incidentally noted. Reproductive: IUD is again noted. A new low-attenuation mass is seen  in the left adnexa which measures 4.2 x 3.6 cm. This is suspicious for an ovarian mass or cyst. No evidence of pelvic inflammatory process or free fluid. Other:  None. Musculoskeletal:  No suspicious bone lesions identified. IMPRESSION: New 4 cm low-attenuation mass in left adnexa, suspicious for ovarian mass or cyst. Pelvic ultrasound is recommended for further characterization. Aortic Atherosclerosis (ICD10-I70.0). Electronically Signed   By: Marlaine Hind M.D.   On: 03/24/2022 17:09    Procedures .Critical Care  Performed by: Nettie Elm, PA-C Authorized by: Nettie Elm, PA-C   Critical care provider statement:    Critical care time (minutes):  45   Critical care was necessary to treat or prevent imminent or life-threatening deterioration of the following conditions: Ovarian torsion.   Critical care was time spent personally by me on the following activities:  Development of treatment plan with patient or surrogate, discussions with consultants, evaluation of patient's response to treatment, examination of patient, ordering and review of laboratory studies, ordering and review of radiographic studies, ordering and performing treatments and interventions, pulse oximetry, re-evaluation of patient's condition and review of old charts     Medications Ordered in ED Medications  ondansetron (ZOFRAN) injection 4 mg (4 mg Intravenous Given 03/24/22 1534)  morphine (PF) 4 MG/ML injection 4 mg (4 mg Intravenous Given 03/24/22 1535)  sodium chloride 0.9 % bolus 1,000 mL (1,000 mLs Intravenous New Bag/Given 03/24/22 1533)  iohexol (OMNIPAQUE) 300 MG/ML solution 80 mL (80 mLs Intravenous Contrast Given 03/24/22 1654)  HYDROmorphone (DILAUDID) injection 1 mg (1 mg Intravenous Given 03/24/22 1727)  ondansetron (ZOFRAN) injection 4 mg (4 mg Intravenous Given 03/24/22 1727)    ED Course/ Medical Decision Making/ A&P    31 year old history of schizophrenia, bipolar, prior appendectomy, bowel  obstruction per patient here for evaluation of left-sided abdominal pain worse to left lower quadrant.  Pain began yesterday.  Pain has been constant in nature she describes as sharp and aching.  She denies any dysuria, hematuria, flank pain.  No history of kidney stones, pyelonephritis.  She denies any vaginal discharge, concern for STDs.  Last bowel movement was yesterday without any melena or bright blood per rectum.  No history of diverticulitis, IBD.  Does admit to multiple episodes of emesis, her last episode had a few specks of bright red blood.  She denies any chronic NSAID use, EtOH use.  No sore throat, neck pain.  Plan on treating her pain, emesis, labs and imaging and reassess  Labs and imaging personally viewed and interpreted:  CBC leukocytosis at [id number] Metabolic panel glucose [id number] Lipase 21 Pregnancy test negative Ultrasound pelvic with torsion  rule out shows CT abdomen and pelvis UA shows ketones, proteinuria suspect due to her emesis and dehydration.  She is given IV fluids CT AP with mass versus cyst at left ovary, recommend ultrasound  Called by radiology.  On [place] there is concern for ovarian torsion on the left which, will discuss with OB/GYN  CONSULT with Dr. Roselie Awkward with [facility], has reviewed imaging.  He will be down to assess the patient to determine disposition  Patient has been assessed at bedside by Dr. Roselie Awkward with OB/GYN.  He will take patient emergently to the OR for her ovarian torsion. Additional pain meds and antiemetics given as patient still in significant pain with persistent emesis.  The patient appears reasonably stabilized for admission considering the current resources, flow, and capabilities available in the ED at this time, and I doubt any other [facility] requiring further screening and/or treatment in the ED prior to admission.                            Medical Decision Making Amount and/or Complexity of Data Reviewed External Data Reviewed: labs, radiology and  notes. Labs: ordered. Decision-making details documented in ED Course. Radiology: ordered and independent interpretation performed. Decision-making details documented in ED Course.  Risk OTC drugs. Prescription drug management. Parenteral controlled substances. Diagnosis or treatment significantly limited by social determinants of health.         Final Clinical Impression(s) / ED Diagnoses Final diagnoses:  Ovarian torsion    Rx / DC Orders ED Discharge Orders     None         Katrenia Alkins A, PA-C 03/24/22 1747    Dorie Rank, MD 03/25/22 1459

## 2022-03-24 NOTE — Anesthesia Preprocedure Evaluation (Addendum)
Anesthesia Evaluation  Patient identified by MRN, date of birth, ID band Patient awake    Reviewed: Allergy & Precautions, NPO status , Patient's Chart, lab work & pertinent test results  History of Anesthesia Complications Negative for: history of anesthetic complications  Airway Mallampati: II  TM Distance: >3 FB Neck ROM: Full    Dental  (+) Dental Advisory Given   Pulmonary Current Smoker and Patient abstained from smoking.,    breath sounds clear to auscultation       Cardiovascular (-) angina Rhythm:Regular Rate:Normal     Neuro/Psych Anxiety Depression Bipolar Disorder Schizophrenia negative neurological ROS     GI/Hepatic negative GI ROS, (+)     substance abuse  marijuana use,   Endo/Other  negative endocrine ROS  Renal/GU negative Renal ROS     Musculoskeletal   Abdominal   Peds  Hematology negative hematology ROS (+)   Anesthesia Other Findings   Reproductive/Obstetrics Ovarian torsion                            Anesthesia Physical Anesthesia Plan  ASA: 2 and emergent  Anesthesia Plan: General   Post-op Pain Management: Ofirmev IV (intra-op)*   Induction: Intravenous  PONV Risk Score and Plan: 3 and Ondansetron, Dexamethasone and Scopolamine patch - Pre-op  Airway Management Planned: Oral ETT  Additional Equipment: None  Intra-op Plan:   Post-operative Plan: Extubation in OR  Informed Consent: I have reviewed the patients History and Physical, chart, labs and discussed the procedure including the risks, benefits and alternatives for the proposed anesthesia with the patient or authorized representative who has indicated his/her understanding and acceptance.     Dental advisory given  Plan Discussed with: CRNA and Surgeon  Anesthesia Plan Comments:        Anesthesia Quick Evaluation

## 2022-03-24 NOTE — Anesthesia Procedure Notes (Signed)
Procedure Name: Intubation Date/Time: 03/24/2022 7:00 PM  Performed by: Rica Records, CRNAPre-anesthesia Checklist: Patient identified, Emergency Drugs available, Suction available and Patient being monitored Patient Re-evaluated:Patient Re-evaluated prior to induction Oxygen Delivery Method: Circle system utilized Preoxygenation: Pre-oxygenation with 100% oxygen Induction Type: IV induction, Rapid sequence and Cricoid Pressure applied Laryngoscope Size: Miller and 2 Grade View: Grade I Tube size: 7.0 mm Number of attempts: 1 Airway Equipment and Method: Stylet Placement Confirmation: ETT inserted through vocal cords under direct vision, positive ETCO2 and breath sounds checked- equal and bilateral Secured at: 22 cm Tube secured with: Tape Dental Injury: Teeth and Oropharynx as per pre-operative assessment

## 2022-03-24 NOTE — Anesthesia Postprocedure Evaluation (Signed)
Anesthesia Post Note  Patient: [name], advertising  Procedure(s) Performed: DIAGNOSTIC LAPAROSCOPY FOR OVARIAN TORSION OOPHORECTOMY (Left)     Patient location during evaluation: PACU Anesthesia Type: General Level of consciousness: awake and alert, patient cooperative and oriented Pain management: pain level controlled (pain improving) Vital Signs Assessment: post-procedure vital signs reviewed and stable Respiratory status: spontaneous breathing, nonlabored ventilation and respiratory function stable Cardiovascular status: blood pressure returned to baseline and stable Postop Assessment: no apparent nausea or vomiting Anesthetic complications: no   No notable events documented.  Last Vitals:  Vitals:   03/24/22 2255 03/24/22 2315  BP: (!) 143/97 (!) 129/91  Pulse: (!) 102 (!) 104  Resp: 15 (!) 27  Temp: 36.5 C   SpO2: 100% 100%    Last Pain:  Vitals:   03/24/22 2315  PainSc: 10-Worst pain ever                 Amoree Newlon,E. Sherhonda Gaspar

## 2022-03-24 NOTE — Transfer of Care (Signed)
Immediate Anesthesia Transfer of Care Note  Patient: Jenna Shelton  Procedure(s) Performed: DIAGNOSTIC LAPAROSCOPY FOR OVARIAN TORSION OOPHORECTOMY (Left)  Patient Location: PACU  Anesthesia Type:General  Level of Consciousness: awake, alert , oriented and patient cooperative  Airway & Oxygen Therapy: Patient Spontanous Breathing and Patient connected to face mask  Post-op Assessment: Report given to RN, Post -op Vital signs reviewed and stable and Patient moving all extremities X 4  Post vital signs: Reviewed and stable  Last Vitals:  Vitals Value Taken Time  BP 143/97 03/24/22 2300  Temp 36.5 C 03/24/22 2255  Pulse 107 03/24/22 2302  Resp 17 03/24/22 2303  SpO2 100 % 03/24/22 2302  Vitals shown include unvalidated device data.  Last Pain:  Vitals:   03/24/22 1728  PainSc: 10-Worst pain ever         Complications: No notable events documented.

## 2022-03-24 NOTE — Op Note (Signed)
Diagnostic Laparoscopy Procedure Note  Indications: The patient is a 31 y.o. female with LLQ pain nausea.  Pre-operative Diagnosis: Ovarian torsion  Post-operative Diagnosis: Necrotic left ovarian and extensive abdominal-pelvic adhesions  Surgeon: Scheryl Darter MD  Assistants: Duane Lope MD  Anesthesia: General endotracheal anesthesia  ASA Class: 2  Procedure Details  The patient was seen in the Holding Room. The risks, benefits, complications, treatment options, and expected outcomes were discussed with the patient. The possibilities of reaction to medication, pulmonary aspiration, perforation of viscus, bleeding, recurrent infection, the need for additional procedures, failure to diagnose a condition, and creating a complication requiring transfusion or operation were discussed with the patient. The patient concurred with the proposed plan, giving informed consent. The patient was taken to the Operating Room, identified as Jenna Shelton and the procedure verified as Diagnostic Laparoscopy. A Time Out was held and the above information confirmed.  After induction of general anesthesia, the patient was placed in modified dorsal lithotomy position where she was prepped, draped, and catheterized in the normal, sterile fashion.  The cervix was visualized and an intrauterine manipulator was placed. A 11 cm umbilical incision was then performed.  The patient has a vertical skin incision from previous laparotomy for appendectomy and bowel obstruction.  The fascia was opened and a Hassan trocar was placed.  I was unable to enter the peritoneal cavity.  I elected to place a 5 mm trocar in the left upper quadrant and a 5 mm scope was used to visualize the abdominal cavity.  CO2 was insufflated.  There were adhesions of the anterior abdomen of the bowel to up to the umbilicus.  The pelvis was encased in adhesions and I was unable to visualize the uterus tubes or ovaries.  As it appeared she would need a  laparotomy with extensive bowel adhesions I called for assistance from Dr. Omelia Blackwater your.  1 more 5 mm port was placed on the left side in order to place a grasper to assess the adhesions.  We elected at that point to proceed with a laparotomy.  #10 blade was used to make a vertical skin incision at the prior at the site of her previous incision.  This was carried down to the fascia and the fascial incision was extended vertically.  Peritoneal cavity was entered.  Immediately encountered adhesions of the small bowel and were unable to initially palpate uterus tubes or ovaries.  Using dissection with Metzenbaum scissors the bowel adhesions were dissected and we attempted to enter the retroperitoneum on the left side.  This involved at least an hour of dissection to reach the retroperitoneal space on that side.  The left ureter was identified and was intact.  Eventually the dome of the uterus was exposed.  The left adnexa were encased with adhesions.  The left ovary appeared to be necrotic and hemorrhagic.  Portions of the left ovary were dissected and there was good hemostasis seen.  This was possibly due to either acute or chronic torsion.  Surgicel gel was placed for hemostasis.  The right adnexa was not exposed.  It too was encased in adhesions.  There was no sign of any bowel injury.  All abdominal packs were removed.  A looped PDS suture was used to close the fascia with a running suture.  The skin incision was irrigated and hemostasis was assured.  The skin was closed with skin staples.  The fascial incision at the umbilicus was closed with 0 Vicryl.  Skin incisions were closed with  staples.  Sterile dressing was applied.  Patient had good urine output from the Foley catheter during the procedure.  The uterine trocar was removed prior to the laparotomy.  Patient received 2 g of cefazolin prior to his laparotomy.  All counts were correct.  She was brought in stable condition to PACU.                                                                  Following the procedure the umbilical sheath was removed after intra-abdominal carbon dioxide was expressed. The incision was closed with subcutaneous and subcuticular sutures of 4-0 Vicryl. The intrauterine manipulator was then removed.  Instrument, sponge, and needle counts were correct prior to abdominal closure and at the conclusion of the case.             Estimated Blood Loss:  less than 100 mL         Drains: Foley catheter         Total IV Fluids:         Specimens: portions of left ovary              Complications:  None; patient tolerated the procedure well.         Disposition: PACU - hemodynamically stable.         Condition: stable  Adam Phenix, MD 03/24/2022 11:27 PM

## 2022-03-24 NOTE — Progress Notes (Signed)
Spoke to patients sister to update her about the patient going for surgery.  Patients sister is Rudene Christians [phone] and she is about 1 hour from hospital but on her way

## 2022-03-25 ENCOUNTER — Encounter (HOSPITAL_COMMUNITY): Payer: Self-pay | Admitting: Obstetrics & Gynecology

## 2022-03-25 LAB — CBC
HCT: 39.2 % (ref 36.0–46.0)
Hemoglobin: 13.4 g/dL (ref 12.0–15.0)
MCH: 34.7 pg — ABNORMAL HIGH (ref 26.0–34.0)
MCHC: 34.2 g/dL (ref 30.0–36.0)
MCV: 101.6 fL — ABNORMAL HIGH (ref 80.0–100.0)
Platelets: 237 10*3/uL (ref 150–400)
RBC: 3.86 MIL/uL — ABNORMAL LOW (ref 3.87–5.11)
RDW: 13.5 % (ref 11.5–15.5)
WBC: 16.4 10*3/uL — ABNORMAL HIGH (ref 4.0–10.5)
nRBC: 0 % (ref 0.0–0.2)

## 2022-03-25 LAB — BASIC METABOLIC PANEL
Anion gap: 9 (ref 5–15)
BUN: 7 mg/dL (ref 6–20)
CO2: 22 mmol/L (ref 22–32)
Calcium: 8.7 mg/dL — ABNORMAL LOW (ref 8.9–10.3)
Chloride: 102 mmol/L (ref 98–111)
Creatinine, Ser: 0.88 mg/dL (ref 0.44–1.00)
GFR, Estimated: >60 mL/min (ref >60)
Glucose, Bld: 154 mg/dL — ABNORMAL HIGH (ref 70–99)
Potassium: 3.5 mmol/L (ref 3.5–5.1)
Sodium: 133 mmol/L — ABNORMAL LOW (ref 135–145)

## 2022-03-25 LAB — GLUCOSE, CAPILLARY: Glucose-Capillary: 120 mg/dL — ABNORMAL HIGH (ref 70–99)

## 2022-03-25 MED ORDER — DIPHENHYDRAMINE HCL 50 MG/ML IJ SOLN
25.0000 mg | Freq: Once | INTRAMUSCULAR | Status: DC
Start: 2022-03-25 — End: 2022-03-25

## 2022-03-25 MED ORDER — BUTORPHANOL TARTRATE 1 MG/ML IJ SOLN: 1.0000 mg | Freq: Once | INTRAMUSCULAR | Status: DC

## 2022-03-25 MED ORDER — BUTORPHANOL TARTRATE 2 MG/ML IJ SOLN
1.0000 mg | Freq: Once | INTRAMUSCULAR | Status: AC
  Administered 2022-03-25: 1 mg via INTRAVENOUS
  Filled 2022-03-25: qty 0.5

## 2022-03-25 MED ORDER — DIPHENHYDRAMINE HCL 50 MG/ML IJ SOLN
50.0000 mg | Freq: Once | INTRAMUSCULAR | Status: DC
Start: 2022-03-25 — End: 2022-03-25

## 2022-03-25 MED ORDER — HYDROMORPHONE HCL 1 MG/ML IJ SOLN
0.2000 mg | INTRAMUSCULAR | Status: DC | PRN
  Administered 2022-03-25 – 2022-03-26 (×7): 0.6 mg via INTRAVENOUS
  Filled 2022-03-25 (×7): qty 1

## 2022-03-25 MED ORDER — DIPHENHYDRAMINE HCL 50 MG/ML IJ SOLN
50.0000 mg | Freq: Once | INTRAMUSCULAR | Status: AC
Start: 2022-03-25 — End: 2022-03-25
  Administered 2022-03-25: 50 mg via INTRAVENOUS
  Filled 2022-03-25: qty 1

## 2022-03-25 MED ORDER — ONDANSETRON HCL 4 MG/2ML IJ SOLN
4.0000 mg | Freq: Four times a day (QID) | INTRAMUSCULAR | Status: DC | PRN
  Administered 2022-03-25 (×2): 4 mg via INTRAVENOUS
  Filled 2022-03-25 (×2): qty 2

## 2022-03-25 MED ORDER — KETOROLAC TROMETHAMINE 30 MG/ML IJ SOLN
30.0000 mg | Freq: Four times a day (QID) | INTRAMUSCULAR | Status: AC
  Administered 2022-03-25 (×4): 30 mg via INTRAVENOUS
  Filled 2022-03-25 (×4): qty 1

## 2022-03-25 MED ORDER — LACTATED RINGERS IV SOLN: INTRAVENOUS | Status: DC

## 2022-03-25 MED ORDER — ONDANSETRON HCL 4 MG PO TABS
4.0000 mg | ORAL_TABLET | Freq: Four times a day (QID) | ORAL | Status: DC | PRN
Start: 2022-03-25 — End: 2022-03-26

## 2022-03-25 MED ORDER — DIPHENHYDRAMINE HCL 50 MG/ML IJ SOLN
25.0000 mg | Freq: Once | INTRAMUSCULAR | Status: AC
Start: 2022-03-25 — End: 2022-03-25
  Administered 2022-03-25: 25 mg via INTRAVENOUS
  Filled 2022-03-25: qty 1

## 2022-03-25 MED ORDER — GABAPENTIN 100 MG PO CAPS
100.0000 mg | ORAL_CAPSULE | Freq: Three times a day (TID) | ORAL | Status: DC
  Filled 2022-03-25: qty 1

## 2022-03-25 MED ORDER — NICOTINE 14 MG/24HR TD PT24
14.0000 mg | MEDICATED_PATCH | Freq: Every day | TRANSDERMAL | Status: DC
Start: 2022-03-25 — End: 2022-03-28
  Administered 2022-03-25 – 2022-03-28 (×4): 14 mg via TRANSDERMAL
  Filled 2022-03-25 (×4): qty 1

## 2022-03-25 MED ORDER — ZOLPIDEM TARTRATE 5 MG PO TABS: 5.0000 mg | ORAL_TABLET | Freq: Every evening | ORAL | Status: DC | PRN

## 2022-03-25 MED ORDER — OXYCODONE-ACETAMINOPHEN 5-325 MG PO TABS
1.0000 | ORAL_TABLET | ORAL | Status: DC | PRN
  Filled 2022-03-25: qty 2

## 2022-03-25 MED ORDER — IBUPROFEN 600 MG PO TABS
600.0000 mg | ORAL_TABLET | Freq: Four times a day (QID) | ORAL | Status: DC
  Filled 2022-03-25: qty 1

## 2022-03-25 NOTE — Progress Notes (Signed)
1 Day Post-Op Procedure(s) (LRB): DIAGNOSTIC LAPAROSCOPY FOR OVARIAN TORSION (N/A) OOPHORECTOMY (Left) Laparotomy, lysis of adhesions Subjective: Patient reports incisional pain.    Objective: I have reviewed patient's vital signs, intake and output, medications, and labs. Blood pressure 105/83, pulse 97, temperature 98.7 F (37.1 C), temperature source Oral, resp. rate 18, SpO2 100 %.  General: alert, cooperative, and no distress Resp: effort normal GI: soft, non-tender; bowel sounds normal; no masses,  no organomegaly and incision: clean, dry, and intact CBC    Component Value Date/Time   WBC 16.4 (H) 03/25/2022 0029   RBC 3.86 (L) 03/25/2022 0029   HGB 13.4 03/25/2022 0029   HCT 39.2 03/25/2022 0029   PLT 237 03/25/2022 0029   MCV 101.6 (H) 03/25/2022 0029   MCH 34.7 (H) 03/25/2022 0029   MCHC 34.2 03/25/2022 0029   RDW 13.5 03/25/2022 0029    Assessment: s/p Procedure(s) with comments: DIAGNOSTIC LAPAROSCOPY FOR OVARIAN TORSION (N/A) OOPHORECTOMY (Left) - open: stable and progressing well  Plan: Encourage ambulation Clear liquids  LOS: 1 day    Scheryl Darter 03/25/2022, 6:45 AM

## 2022-03-26 LAB — URINALYSIS, ROUTINE W REFLEX MICROSCOPIC
Bilirubin Urine: NEGATIVE
Glucose, UA: NEGATIVE mg/dL
Ketones, ur: NEGATIVE mg/dL
Leukocytes,Ua: NEGATIVE
Nitrite: NEGATIVE
Protein, ur: NEGATIVE mg/dL
Specific Gravity, Urine: 1.005 (ref 1.005–1.030)
pH: 6 (ref 5.0–8.0)

## 2022-03-26 LAB — CBC
HCT: 31 % — ABNORMAL LOW (ref 36.0–46.0)
Hemoglobin: 10.3 g/dL — ABNORMAL LOW (ref 12.0–15.0)
MCH: 34.7 pg — ABNORMAL HIGH (ref 26.0–34.0)
MCHC: 33.2 g/dL (ref 30.0–36.0)
MCV: 104.4 fL — ABNORMAL HIGH (ref 80.0–100.0)
Platelets: 207 10*3/uL (ref 150–400)
RBC: 2.97 MIL/uL — ABNORMAL LOW (ref 3.87–5.11)
RDW: 13.7 % (ref 11.5–15.5)
WBC: 8.2 10*3/uL (ref 4.0–10.5)
nRBC: 0 % (ref 0.0–0.2)

## 2022-03-26 LAB — BASIC METABOLIC PANEL
Anion gap: 5 (ref 5–15)
BUN: 8 mg/dL (ref 6–20)
CO2: 28 mmol/L (ref 22–32)
Calcium: 8.5 mg/dL — ABNORMAL LOW (ref 8.9–10.3)
Chloride: 106 mmol/L (ref 98–111)
Creatinine, Ser: 0.82 mg/dL (ref 0.44–1.00)
GFR, Estimated: >60 mL/min (ref >60)
Glucose, Bld: 87 mg/dL (ref 70–99)
Potassium: 3.6 mmol/L (ref 3.5–5.1)
Sodium: 139 mmol/L (ref 135–145)

## 2022-03-26 MED ORDER — ONDANSETRON 4 MG PO TBDP: 4.0000 mg | ORAL_TABLET | Freq: Four times a day (QID) | ORAL | Status: DC | PRN

## 2022-03-26 MED ORDER — BISACODYL 10 MG RE SUPP
10.0000 mg | Freq: Two times a day (BID) | RECTAL | Status: DC | PRN
  Administered 2022-03-27: 10 mg via RECTAL
  Filled 2022-03-26 (×2): qty 1

## 2022-03-26 MED ORDER — GABAPENTIN 250 MG/5ML PO SOLN
100.0000 mg | Freq: Three times a day (TID) | ORAL | Status: DC
  Administered 2022-03-26 – 2022-03-28 (×6): 100 mg via ORAL
  Filled 2022-03-26 (×10): qty 2

## 2022-03-26 MED ORDER — OXYCODONE HCL 5 MG/5ML PO SOLN
5.0000 mg | ORAL | Status: DC | PRN
  Administered 2022-03-26 – 2022-03-28 (×11): 10 mg via ORAL
  Filled 2022-03-26 (×5): qty 10
  Filled 2022-03-26: qty 5
  Filled 2022-03-26: qty 10
  Filled 2022-03-26: qty 5
  Filled 2022-03-26 (×4): qty 10

## 2022-03-26 MED ORDER — CHEWING GUM (ORBIT) SUGAR FREE
1.0000 | CHEWING_GUM | Freq: Three times a day (TID) | ORAL | Status: DC
  Administered 2022-03-26 – 2022-03-28 (×6): 1 via ORAL
  Filled 2022-03-26: qty 1

## 2022-03-26 MED ORDER — IBUPROFEN 100 MG/5ML PO SUSP
600.0000 mg | Freq: Four times a day (QID) | ORAL | Status: DC
  Administered 2022-03-26 – 2022-03-28 (×8): 600 mg via ORAL
  Filled 2022-03-26 (×8): qty 30

## 2022-03-26 NOTE — Progress Notes (Signed)
Gudrun Axe is a 31 y.o. female patient.  1. Ovarian torsion    Past Medical History:  Diagnosis Date   Anxiety    Bipolar 1 disorder (HCC)    Depression    Schizophrenia (HCC)    No past surgical history pertinent negatives on file. 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 rate 18, SpO2 100 %. Vitals:   03/25/22 1149 03/25/22 1527 03/25/22 2017 03/26/22 0520  BP: 110/78 112/74 116/87 114/80  Pulse: 65 68 77 67  Resp: 18 18 18    Temp: 98.5 F (36.9 C) 98.5 F (36.9 C) 98.4 F (36.9 C) 98 F (36.7 C)  TempSrc: Oral Oral Oral   SpO2: 100% 100% 100%       Patient reports threw up once yesterday, has toelrated liquids and a little bit of food crackers since Flattus x 1 No BM.    Objective: I have reviewed patient's vital signs, intake and output, medications, and labs.  General: alert, cooperative, and mild distress GI: soft appropriately tender, non distended sofdt bowel sounds no rushes or tinkles   Assessment/Plan: POD#2: Exploratory lap with extensive lysis of adhesions with left oophorectomy  Pt with significant history of bowel obstruction in distant past following a ruptured appendix, was in hospital 6 months according to patient Continue to carefully monitor bowel function in coming days, anticipate discharge in 48 hours if progresses normally  Pt states pills get stuck  will change to liquid oxycodone  DC IV later if tolerates po and labs OK   LOS: 2 days    03/26/2022, 7:28 AM

## 2022-03-26 NOTE — Progress Notes (Signed)
Staples removed from smaller incisions. Dermabond placed over them.   No flatus but tolerating PO. Reviewed importance of ambulation to prevent ileus and SBO given her surgery. Gum ordered as well. Pt is willing to walk with assistance.   Milas Hock, MD Attending Obstetrician & Gynecologist, [facility], [facility]

## 2022-03-27 MED ORDER — DIPHENHYDRAMINE HCL 12.5 MG/5ML PO ELIX
25.0000 mg | ORAL_SOLUTION | Freq: Four times a day (QID) | ORAL | Status: DC | PRN
  Administered 2022-03-27: 50 mg via ORAL
  Filled 2022-03-27 (×2): qty 20

## 2022-03-27 NOTE — Progress Notes (Signed)
Jenna Shelton is a 31 y.o. female patient.  1. Ovarian torsion    Past Medical History:  Diagnosis Date   Anxiety    Bipolar 1 disorder (HCC)    Depression    Schizophrenia (HCC)    No past surgical history pertinent negatives on file. 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 rate 18, SpO2 100 %. Vitals:   03/26/22 2030 03/27/22 0004 03/27/22 0328 03/27/22 0751  BP: (!) 119/93 110/73 120/82 117/80  Pulse: 85 63 67 62  Resp: 18 16 18 18   Temp: 98.3 F (36.8 C) 98.1 F (36.7 C) 98.4 F (36.9 C) 98.2 F (36.8 C)  TempSrc: Axillary Oral  Oral  SpO2:  100%        Patient tolerating PO. No flatus, no BM. She is ambulatory and voiding spontaneously.     Objective: I have reviewed patient's vital signs, intake and output, medications, and labs.  General: alert, cooperative, and no distress GI: soft, mildly distended, appropriately tender, + normal bowel sounds, vertical incision with honeycomb, staples visible through it, laparoscopic incisions well approximated and c/d/I with dermabond over them.    Assessment/Plan: POD#3: Exploratory lap with extensive lysis of adhesions with left oophorectomy  - Pt with significant history of bowel obstruction in distant past following a ruptured appendix, was in  hospital 6 months according to patient - Continue to carefully monitor bowel function in next 24 hours and after suppository today. Continue gum, continue ambulation.  - Anticipate discharge tomorrow based on her daily improvement.     LOS: 3 days    03/27/2022, 10:55 AM

## 2022-03-27 NOTE — Progress Notes (Signed)
Patient requesting to speak to doctor at this time because she states she doesn't have a ride today to get home if discharged. Would like to stay until tomorrow. Unsure when someone could get her tomorrow. Upon entering room strong smell of cigarettes and marijuana, questioned patient about smoking in room. Patient denies. Advised her that smoking in rooms was not permitted. Patient verbalized understanding.

## 2022-03-28 ENCOUNTER — Encounter (HOSPITAL_COMMUNITY): Payer: Self-pay | Admitting: Obstetrics & Gynecology

## 2022-03-28 ENCOUNTER — Other Ambulatory Visit (HOSPITAL_COMMUNITY): Payer: Self-pay

## 2022-03-28 MED ORDER — ONDANSETRON 4 MG PO TBDP
4.0000 mg | ORAL_TABLET | Freq: Four times a day (QID) | ORAL | 0 refills | Status: AC | PRN
  Filled 2022-03-28: qty 20, 5d supply, fill #0

## 2022-03-28 MED ORDER — OXYCODONE HCL 5 MG/5ML PO SOLN
5.0000 mg | ORAL | 0 refills | Status: AC | PRN
  Filled 2022-03-28: qty 210, 7d supply, fill #0

## 2022-03-28 MED ORDER — ACETAMINOPHEN 160 MG/5ML PO SUSP
500.0000 mg | Freq: Four times a day (QID) | ORAL | 0 refills | Status: AC | PRN
  Filled 2022-03-28: qty 236, 4d supply, fill #0

## 2022-03-28 MED ORDER — BISACODYL 10 MG RE SUPP
10.0000 mg | Freq: Two times a day (BID) | RECTAL | 0 refills | Status: AC | PRN
  Filled 2022-03-28: qty 12, 6d supply, fill #0

## 2022-03-28 MED ORDER — IBUPROFEN 100 MG/5ML PO SUSP
600.0000 mg | Freq: Four times a day (QID) | ORAL | 0 refills | Status: AC
  Filled 2022-03-28: qty 240, 2d supply, fill #0

## 2022-03-28 MED ORDER — GABAPENTIN 250 MG/5ML PO SOLN
100.0000 mg | Freq: Three times a day (TID) | ORAL | 0 refills | Status: AC
  Filled 2022-03-28: qty 42, 7d supply, fill #0

## 2022-03-28 NOTE — Plan of Care (Signed)
  Problem: Education: Goal: Knowledge of General Education information will improve Description: Including pain rating scale, medication(s)/side effects and non-pharmacologic comfort measures Outcome: Completed/Met   Problem: Clinical Measurements: Goal: Respiratory complications will improve Outcome: Completed/Met Goal: Cardiovascular complication will be avoided Outcome: Completed/Met   Problem: Activity: Goal: Risk for activity intolerance will decrease Outcome: Completed/Met   Problem: Nutrition: Goal: Adequate nutrition will be maintained Outcome: Completed/Met   Problem: Coping: Goal: Level of anxiety will decrease Outcome: Completed/Met   Problem: Elimination: Goal: Will not experience complications related to urinary retention Outcome: Completed/Met

## 2022-03-28 NOTE — Discharge Summary (Signed)
Gynecology Physician Postoperative Discharge Summary  Patient ID: Jenna Shelton MRN: [id number] DOB/AGE: [birth year] 31 y.o.  Admit Date: 03/24/2022 Discharge Date: 03/28/2022  Preoperative Diagnoses: Ovarian Torsion  Procedures: Procedure(s) (LRB): DIAGNOSTIC LAPAROSCOPY FOR OVARIAN TORSION (N/A) OOPHORECTOMY (Left)  Hospital Course:  Jenna Shelton is a 31 y.o.  admitted for concern for ovarian torsion and for surgery to manage this.  She underwent the procedures as mentioned above, her operation was complicated by the need to convert to open surgery to significant adhesive disease. For further details about surgery, please refer to the operative report. Patient had an uncomplicated postoperative course. By time of discharge on POD#4, her pain was controlled on oral pain medications; she was ambulating outside, voiding without difficulty, tolerating regular diet and passing flatus as well as small bowel movement. She was deemed stable for discharge to home.   Significant Labs:    Latest Ref Rng & Units 03/26/2022    7:50 AM 03/25/2022   12:29 AM 03/24/2022   12:47 PM  CBC  WBC 4.0 - 10.5 K/uL 8.2  16.4  12.5   Hemoglobin 12.0 - 15.0 g/dL 40.9  81.1  91.4   Hematocrit 36.0 - 46.0 % 31.0  39.2  42.8   Platelets 150 - 400 K/uL 207  237  PLATELET CLUMPS NOTED ON SMEAR, UNABLE TO ESTIMATE     Discharge Exam: Blood pressure 124/88, pulse 69, temperature 98 F (36.7 C), temperature source Oral, resp. rate 16, height 5' 7.5" (1.715 m), weight 66.2 kg, SpO2 98 %. General appearance: alert and no distress  Resp: clear to auscultation bilaterally  Cardio: regular rate and rhythm  GI: soft, non-tender; bowel sounds normal; no masses, no organomegaly.  Incision: C/D/I, no erythema, no drainage noted Extremities: extremities normal, atraumatic, no cyanosis or edema and Homans sign is negative, no sign of DVT  Discharged Condition: Stable  Disposition: Discharge disposition: 01-Home or Self  Care       Discharge Instructions     Activity as tolerated - No restrictions   Complete by: As directed    Call MD for:  difficulty breathing, headache or visual disturbances   Complete by: As directed    Call MD for:  persistant nausea and vomiting   Complete by: As directed    Call MD for:  redness, tenderness, or signs of infection (pain, swelling, redness, odor or green/yellow discharge around incision site)   Complete by: As directed    Call MD for:  severe uncontrolled pain   Complete by: As directed    Call MD for:  temperature >100.4   Complete by: As directed    Diet general   Complete by: As directed    Discharge wound care:   Complete by: As directed    Remove honeycomb dressing after 7 days from surgery (2 days from discharge). An appointment will be made in the office for staple removal for 10 days from now.   Driving Restrictions   Complete by: As directed    When not taking narcotic pain medication and would not hesitate to use the breaks, usually about 7 days   Lifting restrictions   Complete by: As directed    25 lbs   May shower / Bathe   Complete by: As directed       Allergies as of 03/28/2022       Reactions   Tape Hives   blisters   Chocolate Rash   Tramadol Rash  Medication List     STOP taking these medications    promethazine 25 MG suppository Commonly known as: PHENERGAN       TAKE these medications    acetaminophen 160 MG/5ML elixir Commonly known as: TYLENOL Take 15.6 mLs (500 mg total) by mouth every 6 (six) hours as needed for up to 10 days for fever or pain.   bisacodyl 10 MG suppository Commonly known as: DULCOLAX Place 1 suppository (10 mg total) rectally 2 (two) times daily as needed for moderate constipation.   gabapentin 250 MG/5ML solution Commonly known as: NEURONTIN Take 2 mLs (100 mg total) by mouth 3 (three) times daily for 7 days.   ibuprofen 100 MG/5ML suspension Commonly known as: ADVIL Take  30 mLs (600 mg total) by mouth every 6 (six) hours for 10 days.   ondansetron 4 MG disintegrating tablet Commonly known as: ZOFRAN-ODT Take 1 tablet (4 mg total) by mouth every 6 (six) hours as needed for nausea. What changed:  how much to take how to take this when to take this reasons to take this additional instructions   oxyCODONE 5 MG/5ML solution Commonly known as: ROXICODONE Take 5 mLs (5 mg total) by mouth every 4 (four) hours as needed for up to 7 days for moderate pain or severe pain.               Discharge Care Instructions  (From admission, onward)           Start     Ordered   03/28/22 0000  Discharge wound care:       Comments: Remove honeycomb dressing after 7 days from surgery (2 days from discharge). An appointment will be made in the office for staple removal for 10 days from now.   03/28/22 1047           No future appointments.  Follow-up Information     [facility] for [facility] Follow up.   Specialty: Obstetrics and Gynecology Contact information: [address] [place] [address] [phone]                Total discharge time: 45 minutes   Signed:  Milas Hock, MD, FACOG Attending Obstetrician & Gynecologist Faculty Practice, [facility]

## 2022-04-02 ENCOUNTER — Telehealth: Payer: Self-pay | Admitting: Obstetrics and Gynecology

## 2022-04-02 ENCOUNTER — Ambulatory Visit: Payer: Medicare Other

## 2022-04-02 NOTE — Telephone Encounter (Signed)
Patient called to the office requesting to be seen, state she had surgery by Dr.Duncan,   said she think her wound has came open and green discharge coming out, I spoke with a nurse and requested the patient need to be seen, she called me back said she didn't have a ride bc she stay so far away, she said she will go to a ED that's closer to where she stay.

## 2022-04-03 ENCOUNTER — Telehealth: Payer: Self-pay | Admitting: Lactation Services

## 2022-04-03 ENCOUNTER — Encounter: Payer: Self-pay | Admitting: *Deleted

## 2022-04-03 LAB — SURGICAL PATHOLOGY

## 2022-04-11 ENCOUNTER — Ambulatory Visit (INDEPENDENT_AMBULATORY_CARE_PROVIDER_SITE_OTHER): Payer: Medicare Other | Admitting: Obstetrics and Gynecology

## 2022-04-11 ENCOUNTER — Encounter: Payer: Self-pay | Admitting: Obstetrics and Gynecology

## 2022-04-11 ENCOUNTER — Other Ambulatory Visit: Payer: Self-pay

## 2022-04-11 VITALS — BP 95/72 | HR 80 | Wt 131.1 lb

## 2022-04-11 DIAGNOSIS — Z9889 Other specified postprocedural states: Secondary | ICD-10-CM

## 2022-04-11 DIAGNOSIS — Z975 Presence of (intrauterine) contraceptive device: Secondary | ICD-10-CM

## 2022-04-11 MED ORDER — OXYCODONE HCL 5 MG PO TABS: 5.0000 mg | ORAL_TABLET | ORAL | 0 refills | Status: AC | PRN

## 2022-04-11 MED ORDER — OXYCODONE HCL 5 MG PO TABS: 5.0000 mg | ORAL_TABLET | ORAL | 0 refills | Status: DC | PRN

## 2022-04-11 NOTE — Progress Notes (Signed)
Obstetrics and Gynecology Visit Post Op check  Appointment Date: 04/11/2022  Primary Care Provider: Pcp, No  OBGYN Clinic: [facility]   Chief Complaint: post op follow up  History of Present Illness:  Jenna Shelton is a 31 y.o. s/p 6/17 diagnostic laparoscopy converted to ex-lap via vertical midline incision and partial left oophorectomy for abdominal pain. Patient discharged to home on 6/17. She was seen in the [facility] ED on 6/26 for abdominal pain. CT unremarkable and patient started on bactrim, which she states she finished, WBC 11.5; pt missed her 6/26 post op follow up with [place] due to trasnportation issues.   Interval History: Patient states she still notes abdominal pain at the incision but has gone to work as a PCA. No GI or GU s/s.   Review of Systems: as noted in the History of Present Illness.   Patient Active Problem List   Diagnosis Date Noted   Status post surgery 03/24/2022   Pelvic adhesive disease    Medications:  Jenna Shelton had no medications administered during this visit. Current Outpatient Medications  Medication Sig Dispense Refill   gabapentin (NEURONTIN) 250 MG/5ML solution Take 2 mLs (100 mg total) by mouth 3 (three) times daily for 7 days. 42 mL 0   ondansetron (ZOFRAN-ODT) 4 MG disintegrating tablet Take 1 tablet (4 mg total) by mouth every 6 (six) hours as needed for nausea. 20 tablet 0   bisacodyl (DULCOLAX) 10 MG suppository Place 1 suppository (10 mg total) rectally 2 (two) times daily as needed for moderate constipation. (Patient not taking: Reported on 04/11/2022) 12 suppository 0   oxyCODONE (ROXICODONE) 5 MG immediate release tablet Take 1 tablet (5 mg total) by mouth every 4 (four) hours as needed for severe pain. 30 tablet 0   No current facility-administered medications for this visit.    Allergies: is allergic to tape, chocolate, and tramadol.  Physical Exam:  BP 95/72   Pulse 80   Wt 131 lb 1.6 oz  (59.5 kg)   BMI 20.23 kg/m  Body mass index is 20.23 kg/m. General appearance: Well nourished, well developed female in no acute distress.  Abdomen: flat, non distended, appropriately ttp. Vertical midline incision with staples in place>>all removed. At the most inferior edge, it looks like some separation had occurred but the skin is now closed. No e/o separation or infection and no drainage. Steri strips applied along the entire aspect of the incision.  Neuro/Psych:  Normal mood and affect.     Radiology:  IMPRESSION:   1.  Slightly increased small recent soft tissue fluid collection along the ventral midline incision.  2.  Persistent small volume of free pelvic fluid.  3.  Resolved pneumoperitoneum.   Electronically Signed by: Fay Records, MD, on 04/02/2022 7:33 PM Narrative  ABDOMEN AND PELVIS CT WITH INTRAVENOUS CONTRAST:   TECHNIQUE: Multiple axial CT images of the abdomen and pelvis were acquired after the administration of    100 mL of IOPAMIDOL 76 % IV SOLN intravenous contrast. Coronal and sagittal images were obtained. CT dose reduction techniques utilized.   PROVIDED CLINICAL INDICATION: recent Rt oopherectomy; eval post op infxn  ADDITIONAL CLINICAL INDICATION: None available   COMPARISON: Body CT dated 03/29/2022   FINDINGS:  LUNG BASES:  The lung bases are within normal limits.   ABDOMEN:  The liver is without focal abnormality. Gallbladder is normal. There is no biliary duct dilatation.The spleen is within normal limits. The pancreas is unremarkable. There are no  adrenal gland nodules. The kidneys are unremarkable. There is no urinary tract calculus or hydronephrosis. No acute findings in the bowel. There is no free fluid or pneumoperitoneum. Pneumoperitoneum has resolved. There are mild atherosclerotic calcifications of the aorta.  No aneurysm.   PELVIS:  The pelvic organs are unremarkable. There is an intrauterine device. Surgical clips are seen in the left  adnexa.  There is a persistent small volume of free fluid in the pelvis. No pneumoperitoneum. The bones are age-appropriate. There is a ventral midline incision with overlying skin staples, and slightly increased fluid collection along the incision containing small foci of air, measuring 32 x 17 x 35 mm. There is no significant rim enhancement. Addendum  Addendum by Johnsie Cancel, MD on 04/02/2022  7:40 PM EDT  Persistent prominent fluid-filled small bowel loops with slightly wall  thickening, most likely representing enteritis or postoperative ileus. No  high-grade bowel obstruction.   Electronically Signed by: Fay Records, MD, on 04/02/2022 7:40 PM Procedure Note  Johnsie Cancel, MD - 04/02/2022  Formatting of this note might be different from the original.  ABDOMEN AND PELVIS CT WITH INTRAVENOUS CONTRAST:   TECHNIQUE: Multiple axial CT images of the abdomen and pelvis were acquired after the administration of    100 mL of IOPAMIDOL 76 % IV SOLN intravenous contrast. Coronal and sagittal images were obtained. CT dose reduction techniques utilized.   PROVIDED CLINICAL INDICATION: recent Rt oopherectomy; eval post op infxn  ADDITIONAL CLINICAL INDICATION: None available   COMPARISON: Body CT dated 03/29/2022   FINDINGS:  LUNG BASES:  The lung bases are within normal limits.   ABDOMEN:  The liver is without focal abnormality. Gallbladder is normal. There is no biliary duct dilatation.The spleen is within normal limits. The pancreas is unremarkable. There are no adrenal gland nodules. The kidneys are unremarkable. There is no urinary tract calculus or hydronephrosis. No acute findings in the bowel. There is no free fluid or pneumoperitoneum. Pneumoperitoneum has resolved. There are mild atherosclerotic calcifications of the aorta.  No aneurysm.   PELVIS:  The pelvic organs are unremarkable. There is an intrauterine device. Surgical clips are seen in the left adnexa.  There is a  persistent small volume of free fluid in the pelvis. No pneumoperitoneum. The bones are age-appropriate. There is a ventral midline incision with overlying skin staples, and slightly increased fluid collection along the incision containing small foci of air, measuring 32 x 17 x 35 mm. There is no significant rim enhancement.    IMPRESSION:   1.  Slightly increased small recent soft tissue fluid collection along the ventral midline incision.  2.  Persistent small volume of free pelvic fluid.  3.  Resolved pneumoperitoneum.   Electronically Signed by: Fay Records, MD, on 04/02/2022 7:33 PM  Assessment: pt stable  Plan:  Post op Pt lives in [place] and has 7/12 follow up with a GYN doctor there, which I told her to keep and if she is able to keep that appointment to cancel her 7/13 one here due to transportation issues  Cornelia Copa MD Attending Center for [facility])

## 2022-04-17 ENCOUNTER — Telehealth: Payer: Self-pay | Admitting: General Practice

## 2022-04-17 MED ORDER — IBUPROFEN 800 MG PO TABS: 800.0000 mg | ORAL_TABLET | Freq: Three times a day (TID) | ORAL | 0 refills | Status: AC | PRN

## 2022-04-17 NOTE — Telephone Encounter (Signed)
Called patient regarding cancelled appt on 7/13. Explained to patient the appointment was cancelled because we were under the impression she was seeing her GYN where she lives tomorrow. Patient states yes that is correct but they had said before they wouldn't do anything about her stitches, that she would have to come here. Asked patient if they agreed to still see her tomorrow and she states yes. Told patient hopefully the doctor will be willing to look at her stitches when she gets there since they will already be seeing her. Patient states she doesn't think they will refill her oxycodone though. Told patient we usually do not refill that prescription but I can send in Ibuprofen 800mg  to her pharmacy. Patient verbalized understanding and would like Rx. Rx sent to pharmacy per protocol. Patient will call back if any issues at her appt tomorrow for follow up.

## 2022-04-19 ENCOUNTER — Ambulatory Visit: Payer: Medicare Other | Admitting: Obstetrics & Gynecology

## 2022-10-30 ENCOUNTER — Ambulatory Visit: Payer: Self-pay | Admitting: *Deleted

## 2022-10-30 NOTE — Telephone Encounter (Signed)
Reason for Disposition  [1] Intermittent lower abdominal pain (e.g., cramping) AND [2] present > 24 hours  Answer Assessment - Initial Assessment Questions 1. ONSET: "When did this bleeding start?"       This morning- pink discharge 2. DESCRIPTION: "Describe the bleeding that you are having." "How much bleeding is there?"    - SPOTTING: spotting, or pinkish / brownish mucous discharge; does not fill panty liner or pad    - MILD:  less than 1 pad / hour; less than patient's usual menstrual bleeding   - MODERATE: 1-2 pads / hour; 1 menstrual cup every 6 hours; small-medium blood clots (e.g., pea, grape, small coin)   - SEVERE: soaking 2 or more pads/hour for 2 or more hours; 1 menstrual cup every 2 hours; bleeding not contained by pads or continuous red blood from vagina; large blood clots (e.g., golf ball, large coin)      spotting 3. ABDOMINAL PAIN SEVERITY: If present, ask: "How bad is it?"  (e.g., Scale 1-10; mild, moderate, or severe)   - MILD (1-3): doesn't interfere with normal activities, abdomen soft and not tender to touch    - MODERATE (4-7): interferes with normal activities or awakens from sleep, abdomen tender to touch    - SEVERE (8-10): excruciating pain, doubled over, unable to do any normal activities     Cramping- pain- this morning- moderate 4. PREGNANCY: "Do you know how many weeks or months pregnant you are?" "When was the first day of your last normal menstrual period?"     14.2 weeks 5. HEMODYNAMIC STATUS: "Are you weak or feeling lightheaded?" If Yes, ask: "Can you stand and walk normally?"      no 6. OTHER SYMPTOMS: "What other symptoms are you having with the bleeding?" (e.g., passed tissue, vaginal discharge, fever, menstrual-type cramps)    none  Protocols used: Pregnancy - Vaginal Bleeding Less Than [redacted] Weeks EGA-A-AH

## 2022-10-30 NOTE — Telephone Encounter (Signed)
  Chief Complaint: spotting with cerclage in place-14.2 wks Symptoms: spotting- pink, pressure- cramping Frequency: started this morning Pertinent Negatives: Patient denies passing tissue, vaginal discharge Disposition: [] ED /[x] Urgent Care (no appt availability in office) / [] Appointment(In office/virtual)/ []  [place] [facility]/ [] Home Care/ [] Refused Recommended Disposition /[] [place] [facility]/ []  Follow-up with PCP Additional Notes: Patient advised contact OB provider- may want to check cervix- within 24 hours

## 2023-06-05 IMAGING — CT CT ABD-PELV W/ CM
2 of 4 series · 16 of 46 positions shown, 18 images · IV contrast (APPLIED)
Comparison: None Available.

CLINICAL DATA: Abdominal pain, acute, nonlocalized

EXAM:
CT ABDOMEN AND PELVIS WITH CONTRAST
TECHNIQUE: Multidetector CT imaging of the abdomen and pelvis was performed
using the standard protocol following bolus administration of
intravenous contrast.

[Series 3: abdomen 5.0 · axial · 0.72mm/px · z∈[+770,+1165]mm · 13 of 89 slices shown, 15 images]
[im 5/89  soft-tissue]
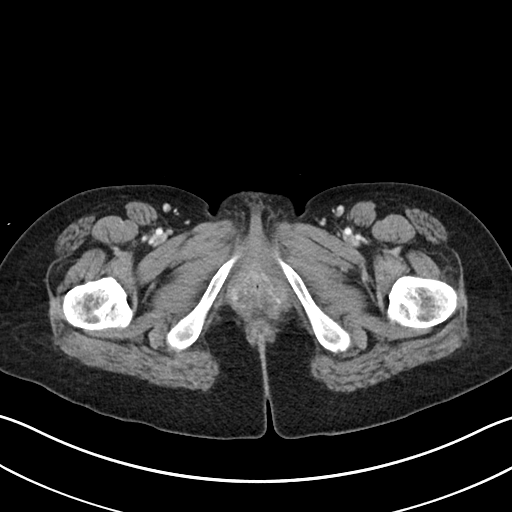
[im 5/89  bone]
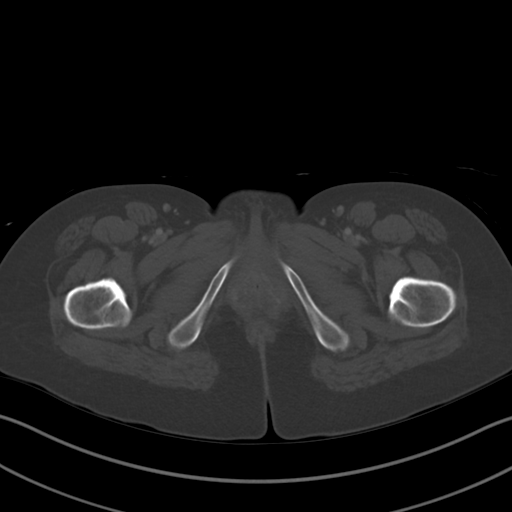
[im 14/89  soft-tissue]
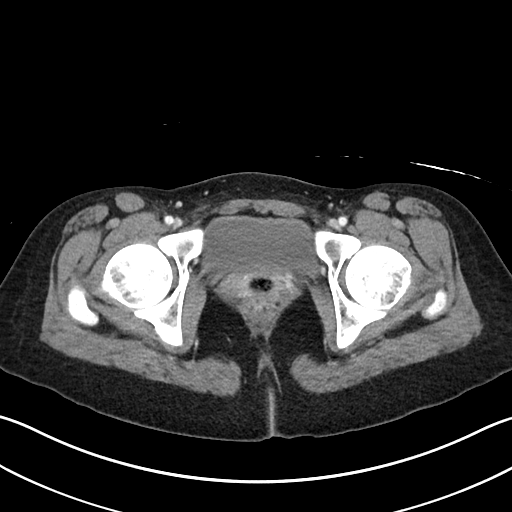
[im 19/89  soft-tissue]
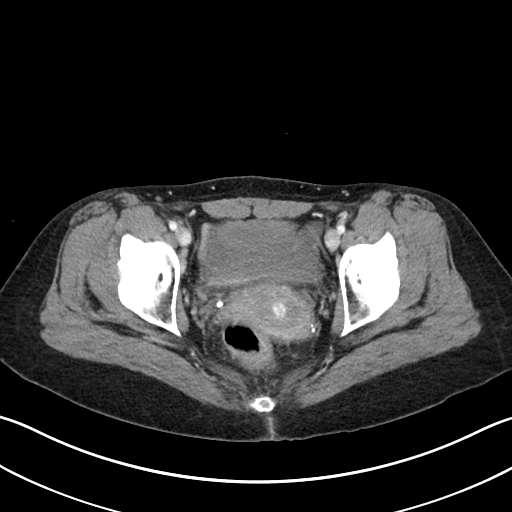
[im 24/89  soft-tissue]
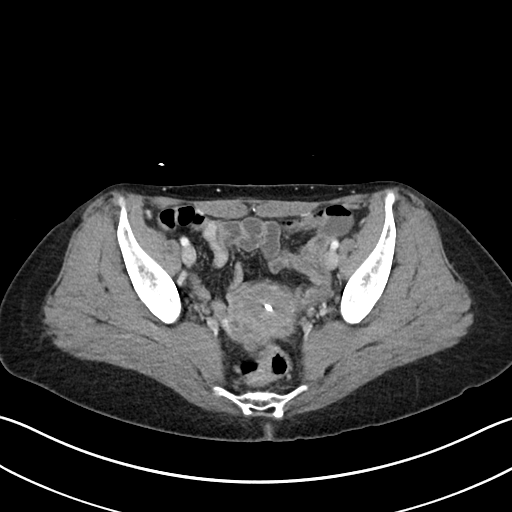
[im 33/89  soft-tissue]
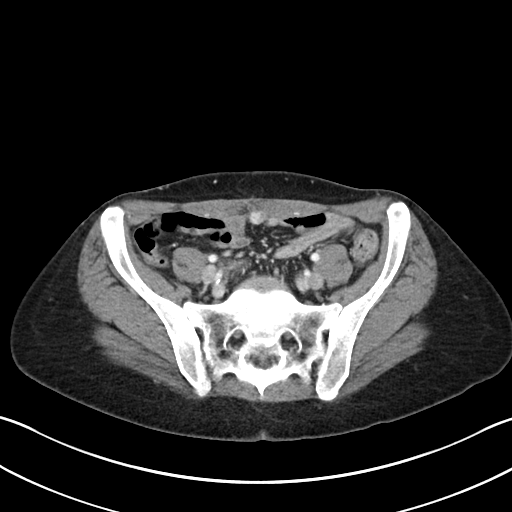
[im 38/89  soft-tissue]
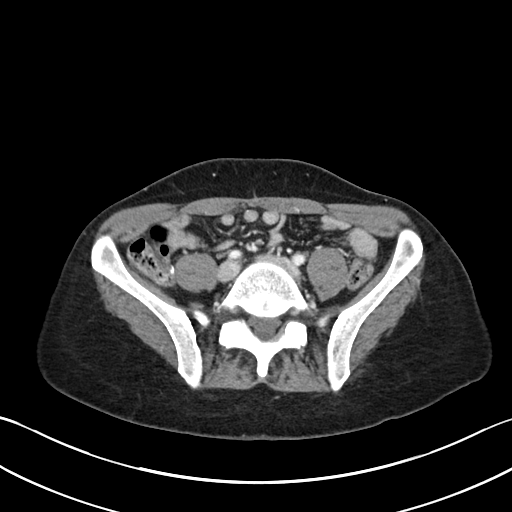
[im 47/89  soft-tissue]
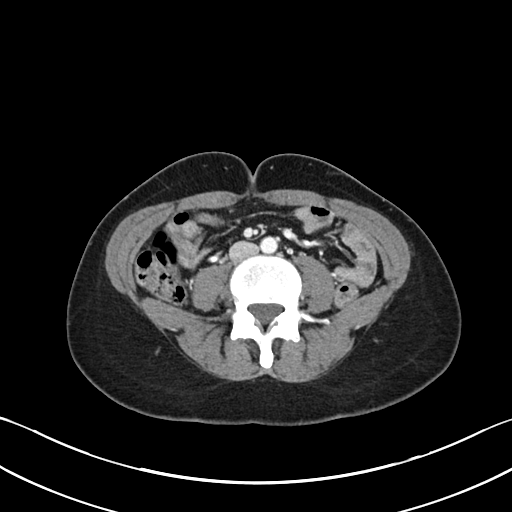
[im 51/89  soft-tissue]
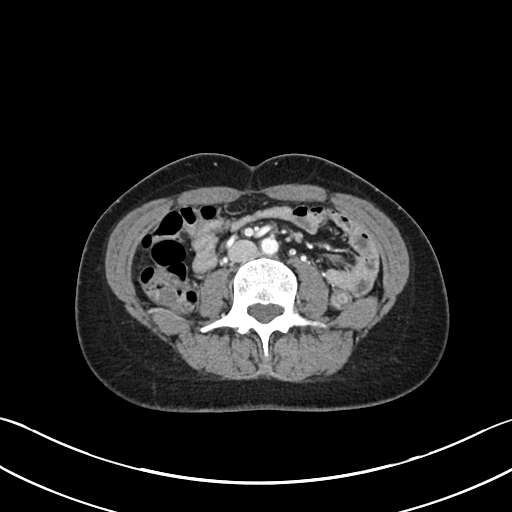
[im 56/89  soft-tissue]
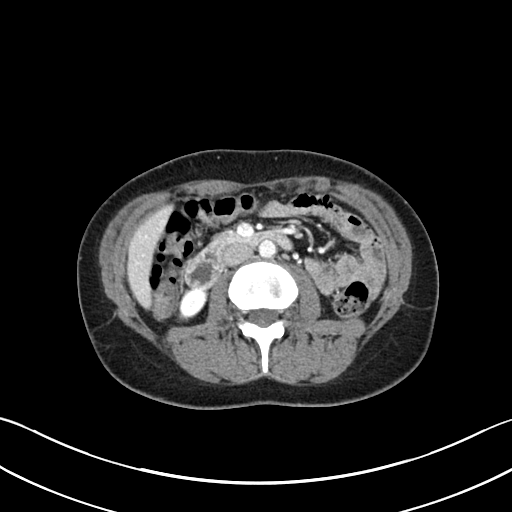
[im 56/89  bone]
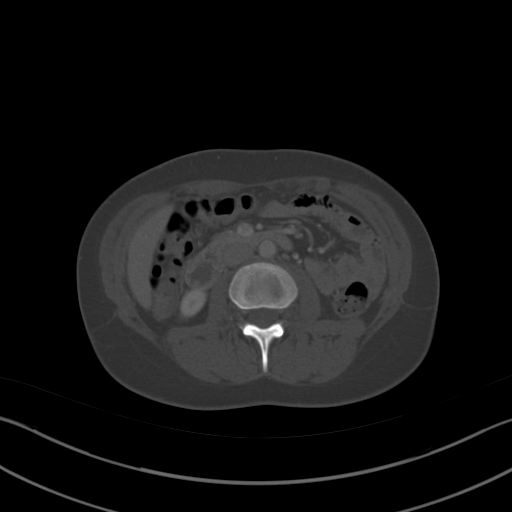
[im 65/89  soft-tissue]
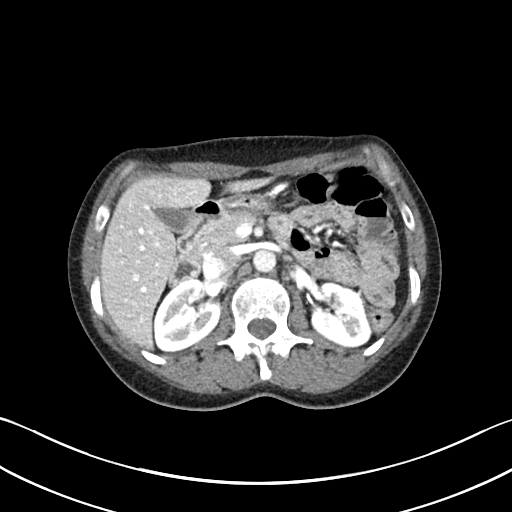
[im 70/89  soft-tissue]
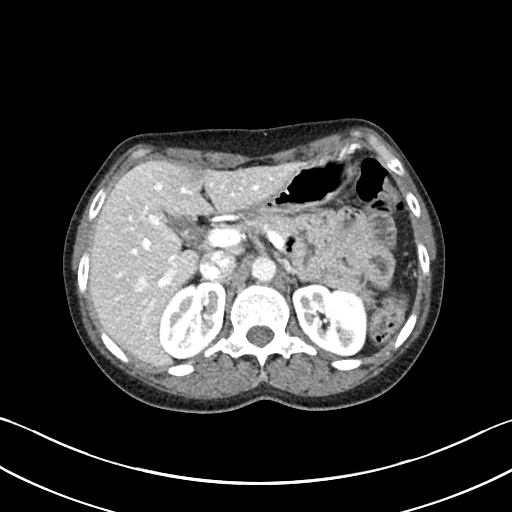
[im 75/89  soft-tissue]
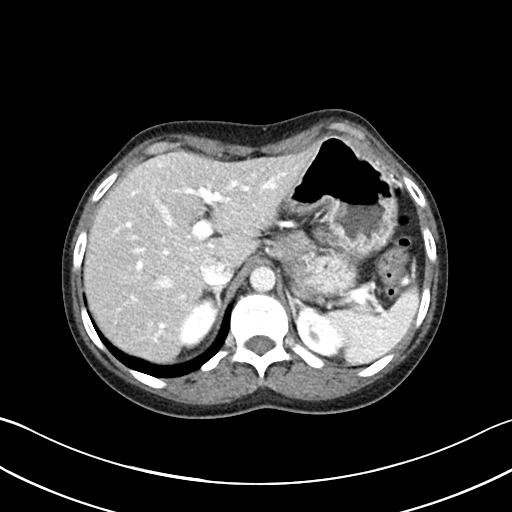
[im 84/89  soft-tissue]
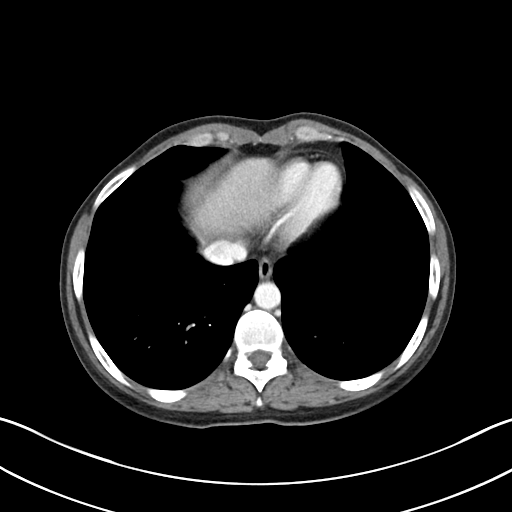

[Series 6: abdomen 3.0 mpr cor · coronal · 0.69mm/px · 3 of 71 slices shown]
[im 24/71  soft-tissue]
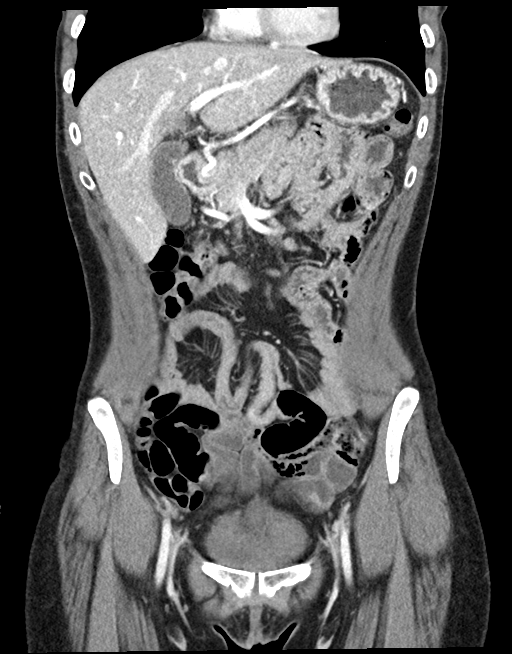
[im 32/71  soft-tissue]
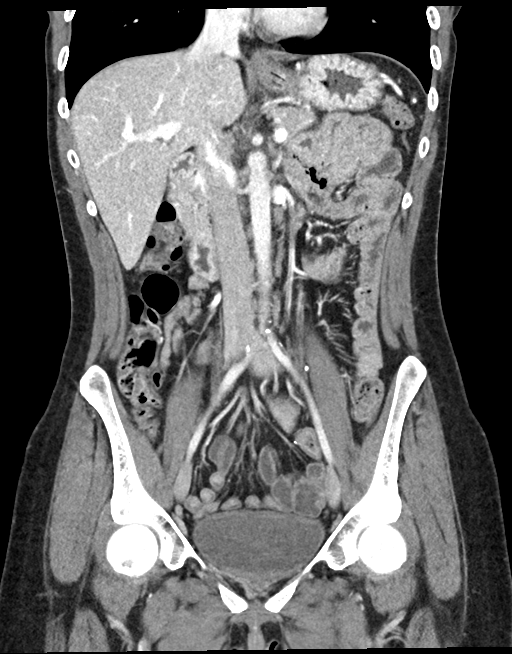
[im 39/71  soft-tissue]
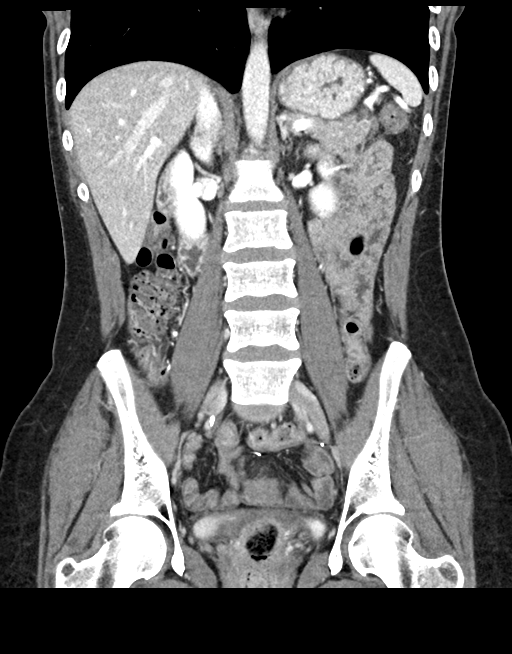

[16 of 46 positions shown; findings below may reference images not displayed]

RADIATION DOSE REDUCTION: This exam was performed according to the
departmental dose-optimization program which includes automated
exposure control, adjustment of the mA and/or kV according to
patient size and/or use of iterative reconstruction technique.

CONTRAST:  80mL OMNIPAQUE IOHEXOL 300 MG/ML  SOLN
FINDINGS: Lower chest: No acute abnormality.

Hepatobiliary: No focal liver abnormality is seen. The gallbladder
is unremarkable.

Pancreas: Unremarkable. No pancreatic ductal dilatation or
surrounding inflammatory changes.

Spleen: Normal in size without focal abnormality.

Adrenals/Urinary Tract: Adrenal glands are unremarkable. No
hydronephrosis or nephrolithiasis. The bladder is mildly distended.

Stomach/Bowel: The stomach is within normal limits. There is no
evidence of bowel obstruction.Prior appendectomy.

Vascular/Lymphatic: Aortoiliac atherosclerosis. No AAA. No
lymphadenopathy.

Reproductive: Unremarkable for age. There is an IUD in place. There
is a spongiform material within the vagina, possibly a tampon.

Other: No abdominopelvic ascites.  No free air.  No hernia.

Musculoskeletal: No acute or significant osseous findings.
IMPRESSION: No acute findings in the abdomen or pelvis.

Prior appendectomy.

Tampon in place.

## 2023-07-20 IMAGING — CT CT ABD-PELV W/ CM
2 of 4 series · 16 of 46 positions shown, 18 images · IV contrast (APPLIED)
Comparison: 02/07/2022

CLINICAL DATA: Left lower quadrant pain and left pelvic pain
beginning last night. Vomiting.

EXAM:
CT ABDOMEN AND PELVIS WITH CONTRAST
TECHNIQUE: Multidetector CT imaging of the abdomen and pelvis was performed
using the standard protocol following bolus administration of
intravenous contrast.

[Series 3: abdomen 5.0 · axial · 0.85mm/px · z∈[+1012,+1402]mm · 13 of 86 slices shown, 15 images]
[im 4/86  soft-tissue]
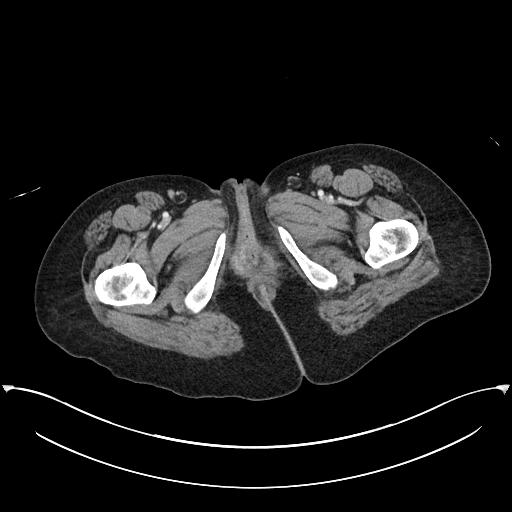
[im 4/86  bone]
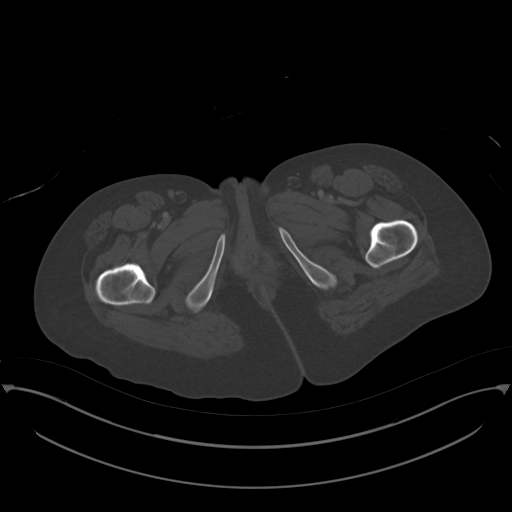
[im 12/86  soft-tissue]
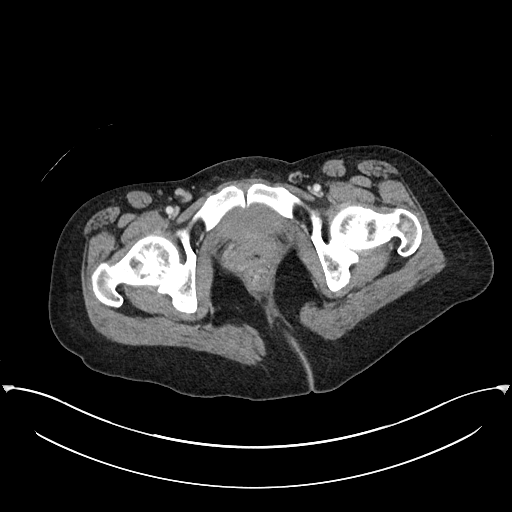
[im 20/86  soft-tissue]
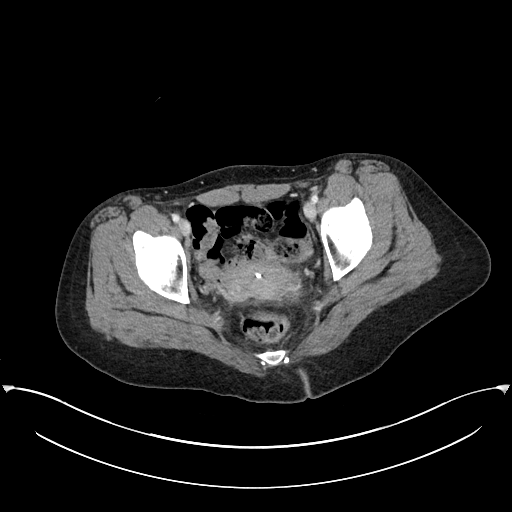
[im 24/86  soft-tissue]
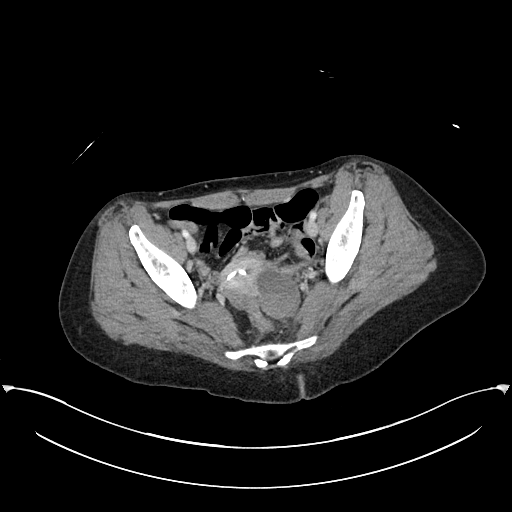
[im 31/86  soft-tissue]
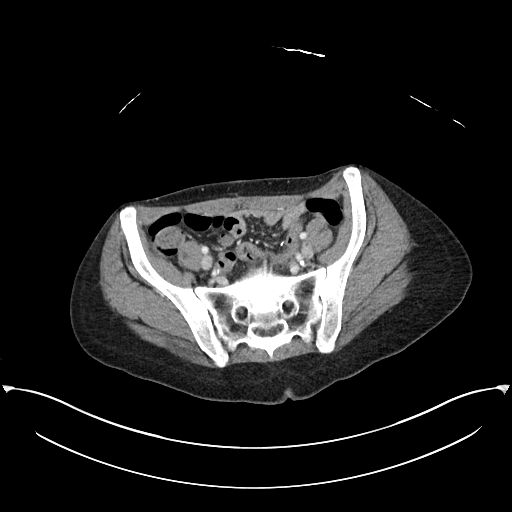
[im 35/86  soft-tissue]
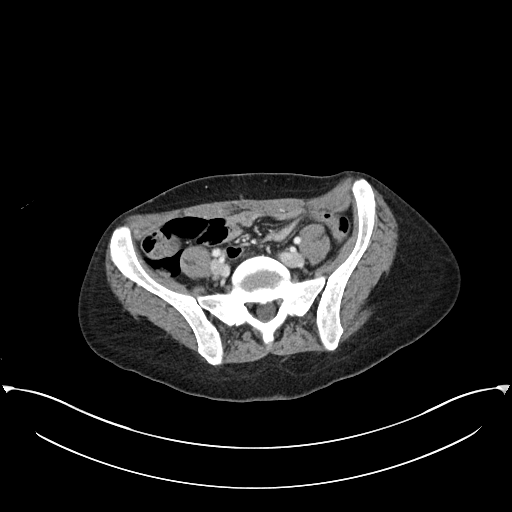
[im 43/86  soft-tissue]
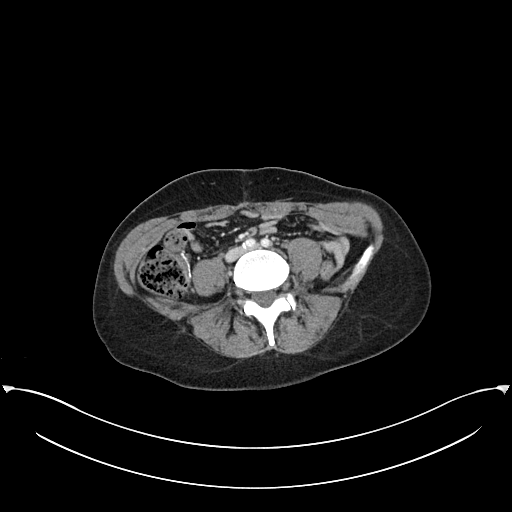
[im 51/86  soft-tissue]
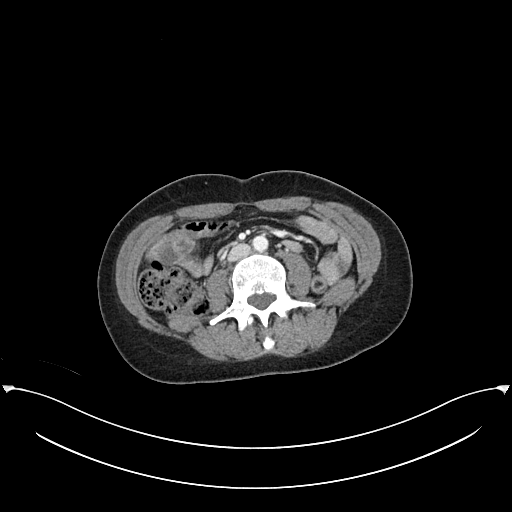
[im 55/86  soft-tissue]
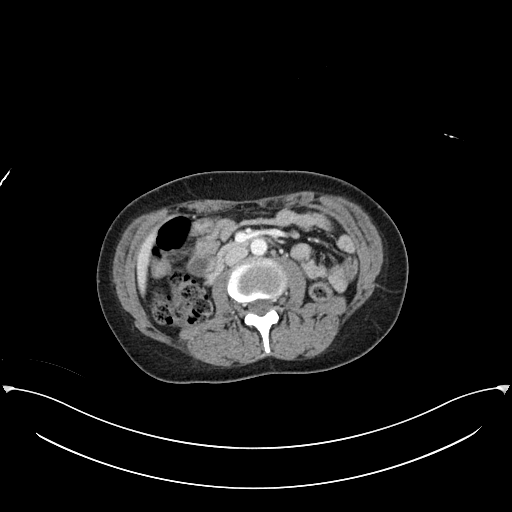
[im 55/86  bone]
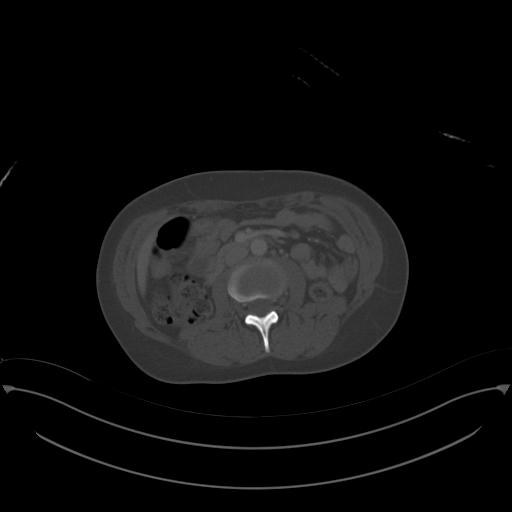
[im 62/86  soft-tissue]
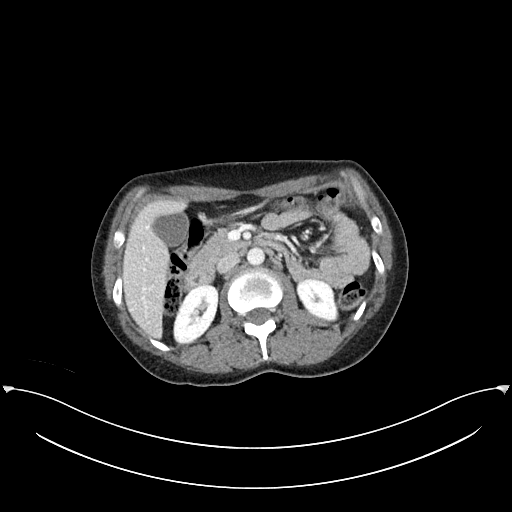
[im 66/86  soft-tissue]
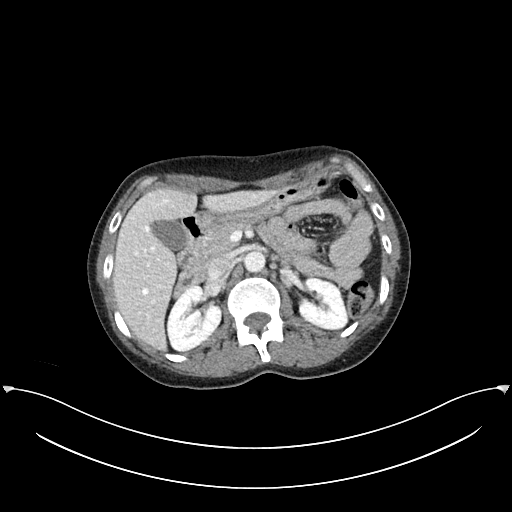
[im 74/86  soft-tissue]
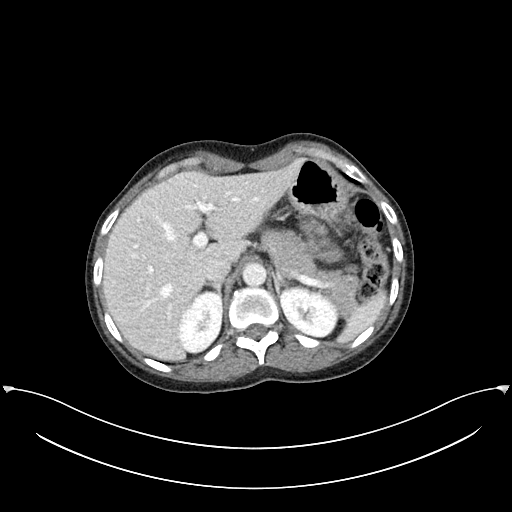
[im 82/86  soft-tissue]
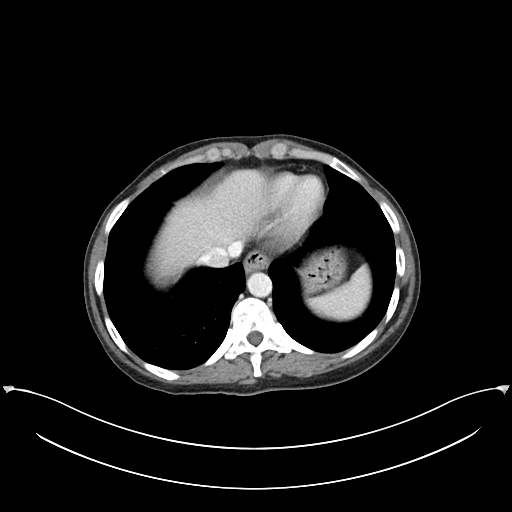

[Series 6: abdomen 3.0 mpr cor · coronal · 0.78mm/px · 3 of 78 slices shown]
[im 26/78  soft-tissue]
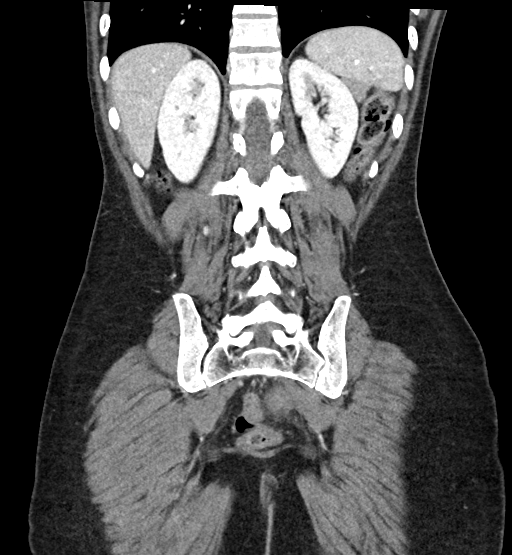
[im 35/78  soft-tissue]
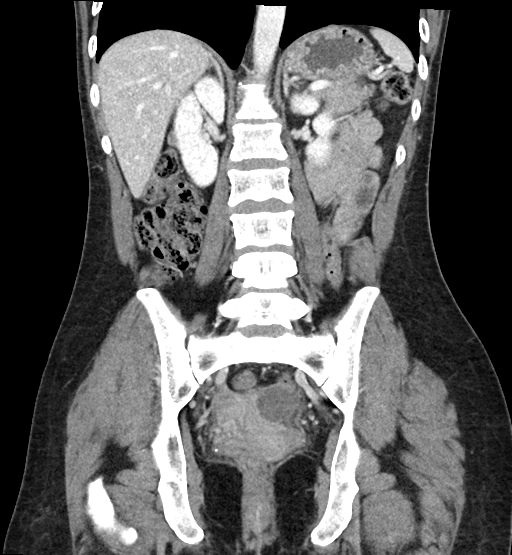
[im 43/78  soft-tissue]
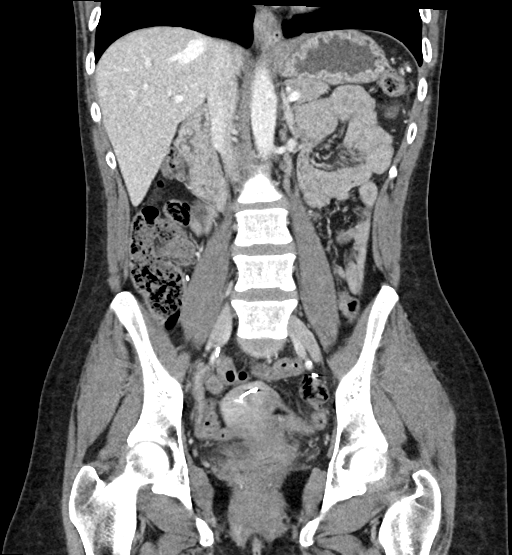

[16 of 46 positions shown; findings below may reference images not displayed]

RADIATION DOSE REDUCTION: This exam was performed according to the
departmental dose-optimization program which includes automated
exposure control, adjustment of the mA and/or kV according to
patient size and/or use of iterative reconstruction technique.

CONTRAST:  80mL OMNIPAQUE IOHEXOL 300 MG/ML  SOLN
FINDINGS: Lower Chest: No acute findings.

Hepatobiliary: No hepatic masses identified. Gallbladder is
unremarkable. No evidence of biliary ductal dilatation.

Pancreas:  No mass or inflammatory changes.

Spleen: Within normal limits in size and appearance.

Adrenals/Urinary Tract: No masses identified. No evidence of
ureteral calculi or hydronephrosis.

Stomach/Bowel: No evidence of obstruction, inflammatory process or
abnormal fluid collections.

Vascular/Lymphatic: No pathologically enlarged lymph nodes. No acute
vascular findings. Aortic atherosclerotic calcification incidentally
noted.

Reproductive: IUD is again noted. A new low-attenuation mass is seen
in the left adnexa which measures 4.2 x 3.6 cm. This is suspicious
for an ovarian mass or cyst. No evidence of pelvic inflammatory
process or free fluid.

Other:  None.

Musculoskeletal:  No suspicious bone lesions identified.
IMPRESSION: New 4 cm low-attenuation mass in left adnexa, suspicious for ovarian
mass or cyst. Pelvic ultrasound is recommended for further
characterization.

Aortic Atherosclerosis ([id number]).

## 2023-07-20 IMAGING — US US PELVIS COMPLETE TRANSABD/TRANSVAG W DUPLEX AND/OR DOPPLER
1 series · 13 of 25 positions shown · non-contrast
Comparison: CT on 03/24/2022

CLINICAL DATA: Left lower quadrant and pelvic pain. Left adnexal
mass on recent ultrasound.

EXAM:
TRANSABDOMINAL AND TRANSVAGINAL ULTRASOUND OF PELVIS
DOPPLER ULTRASOUND OF OVARIES
TECHNIQUE: Both transabdominal and transvaginal ultrasound examinations of the
pelvis were performed. Transabdominal technique was performed for
global imaging of the pelvis including uterus, ovaries, adnexal
regions, and pelvic cul-de-sac.
It was necessary to proceed with endovaginal exam following the
transabdominal exam to visualize the ovaries and left adnexal mass.
Color and duplex Doppler ultrasound was utilized to evaluate blood
flow to the ovaries.

[Series 1: us pelvic complete w transvaginal and torsion righ · 13 of 54 slices shown]
[im 1/54]
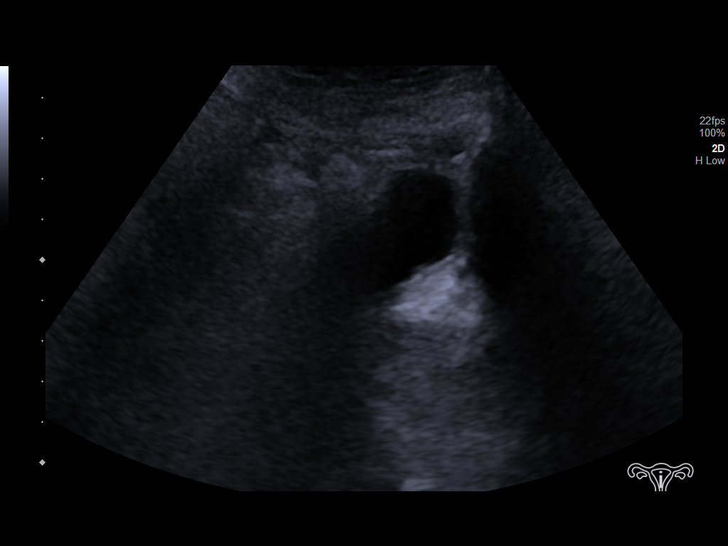
[im 5/54]
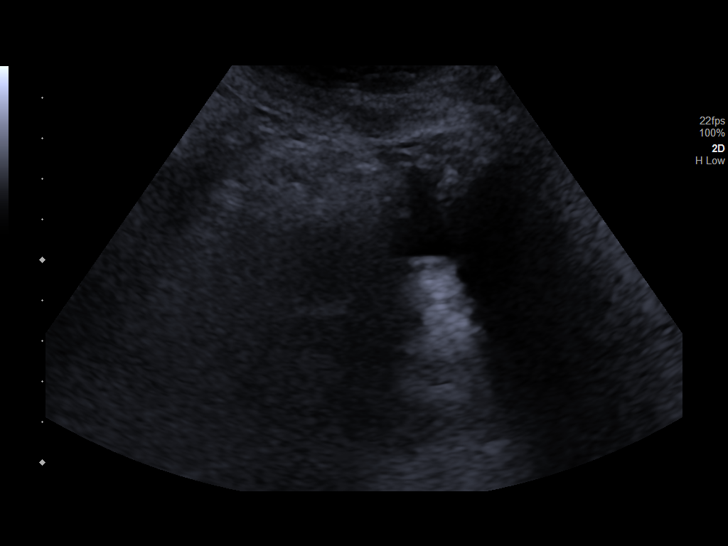
[im 9/54]
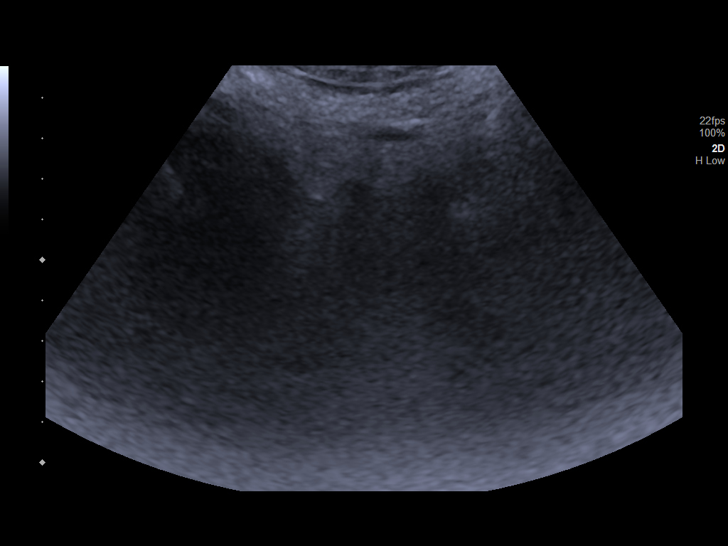
[im 14/54]
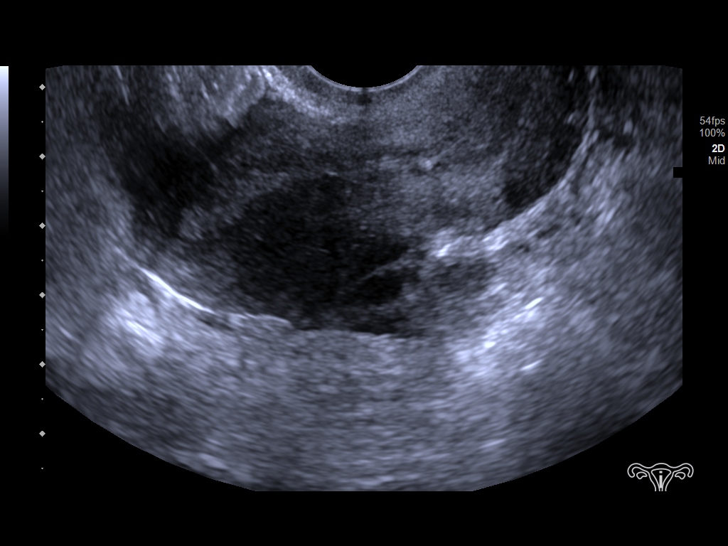
[im 18/54]
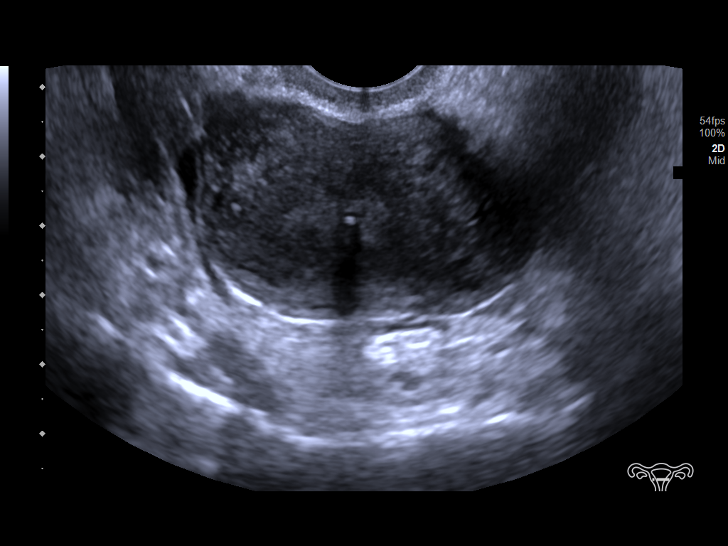
[im 23/54]
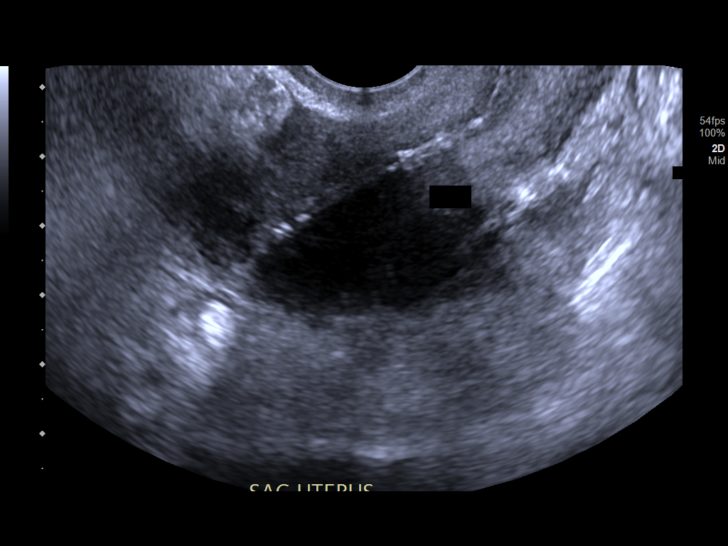
[im 27/54]
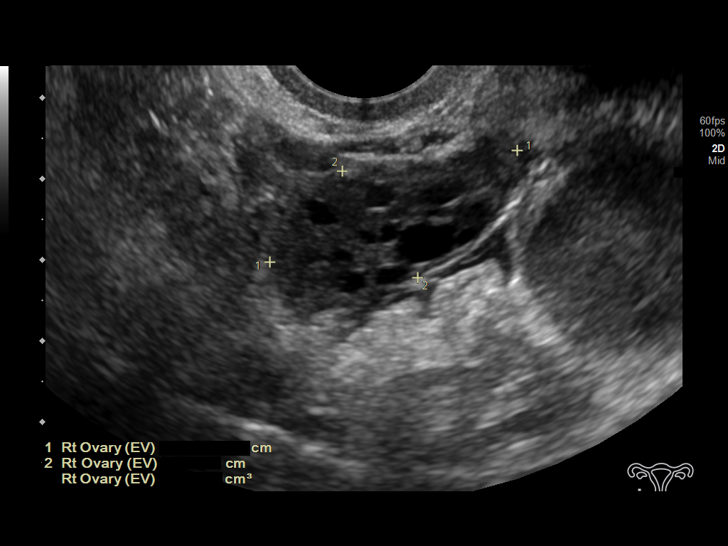
[im 31/54]
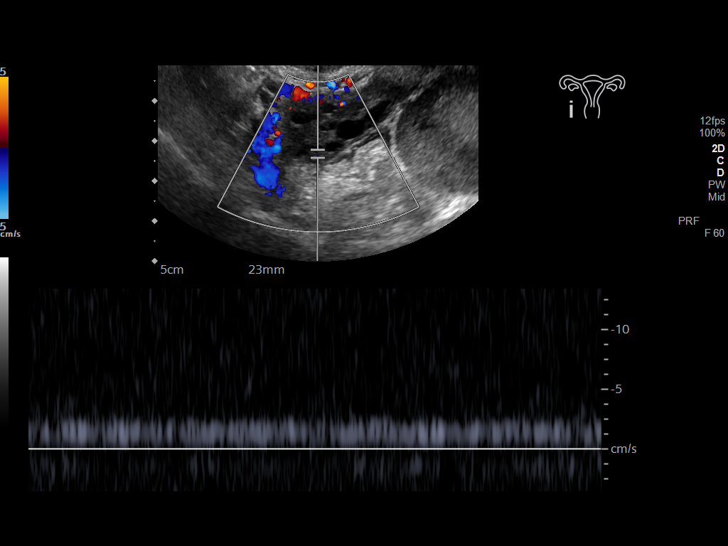
[im 36/54]
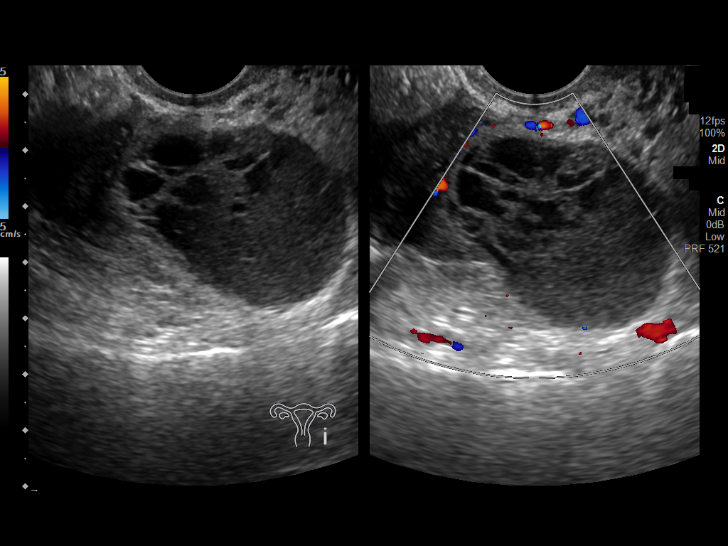
[im 40/54]
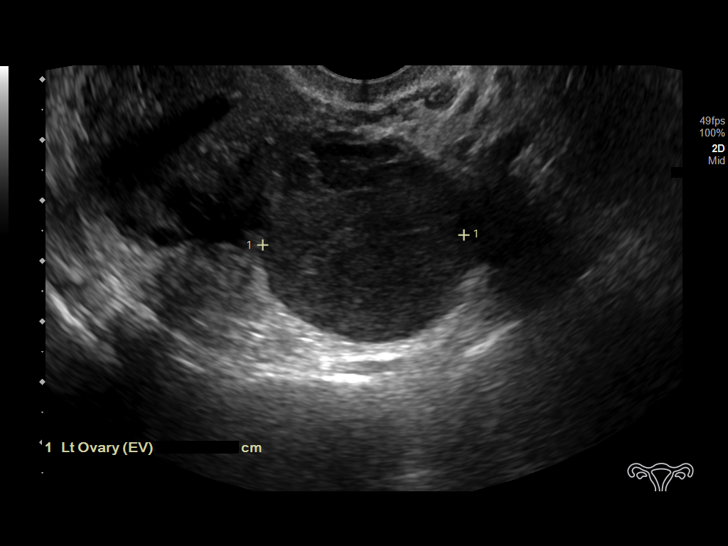
[im 45/54]
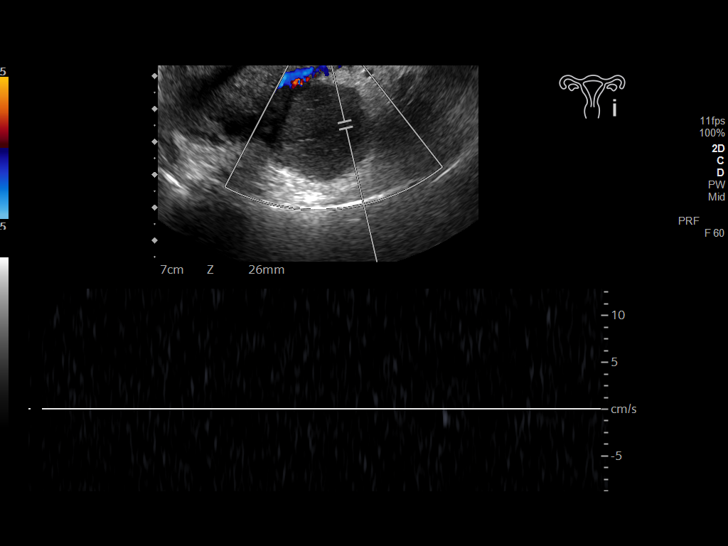
[im 49/54]
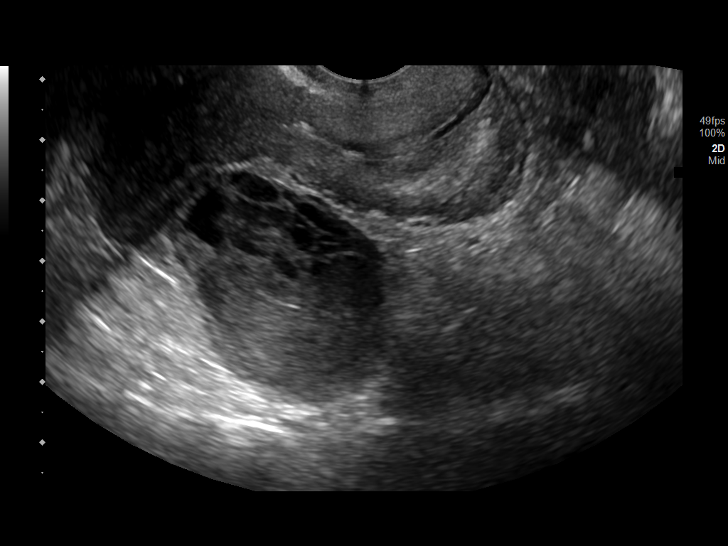
[im 54/54]
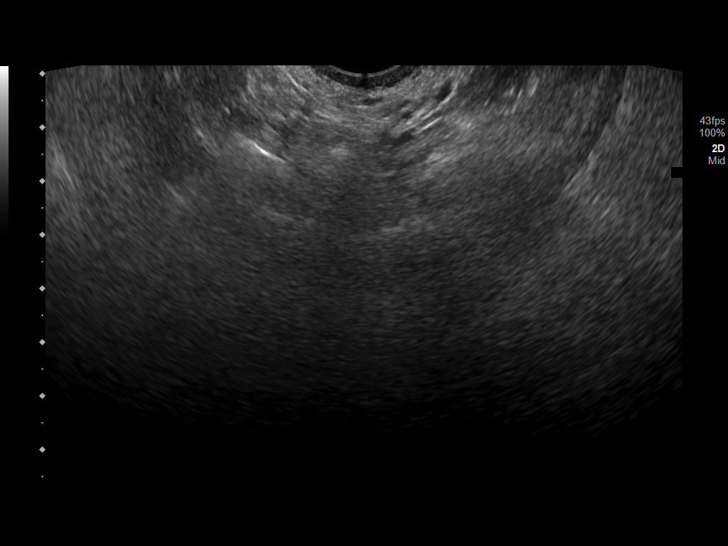

[13 of 25 positions shown; findings below may reference images not displayed]

FINDINGS: Uterus

Measurements: 7.1 x 3.1 x 4.0 cm = volume: 44 mL. No fibroids or
other mass visualized.

Endometrium

Thickness: 4 mm.  IUD is visualized within the endometrial cavity.

Right ovary

Measurements: 3.4 x 1.6 x 2.0 cm = volume: 5.5 mL. Normal
appearance/no adnexal mass. Internal blood flow seen on color
Doppler ultrasound.

Left ovary

Measurements: 4.4 x 3.4 x 3.3 cm = volume: 26.2 mL. The left ovary
has a heterogeneous appearance with presence of numerous tiny
internal follicles as well as an area which appears hemorrhagic. No
internal blood flow is seen on color Doppler ultrasound.

Pulsed Doppler evaluation of both ovaries demonstrates normal
low-resistance arterial and venous waveforms in the right ovary. No
internal blood flow is identified within the left ovary with either
duplex or power Doppler ultrasound. This is consistent with left
ovarian torsion.

Other findings

No abnormal free fluid.
IMPRESSION: Findings consistent with left ovarian torsion.

Normal appearance of right ovary.

IUD within the endometrial cavity.

Critical Value/emergent results were called by telephone at the time
of interpretation on 03/24/2022 at [DATE] to provider [facility] , who verbally acknowledged these results.
